# Patient Record
Sex: Female | Born: 1965 | Race: Black or African American | Hispanic: No | Marital: Married | Smoking: Former smoker
Health system: Southern US, Community
[De-identification: ages and names within clinical notes are randomized; demographics above are authoritative.]

## PROBLEM LIST (undated history)

## (undated) DIAGNOSIS — N184 Chronic kidney disease, stage 4 (severe): Secondary | ICD-10-CM

## (undated) DIAGNOSIS — E785 Hyperlipidemia, unspecified: Secondary | ICD-10-CM

## (undated) DIAGNOSIS — J432 Centrilobular emphysema: Secondary | ICD-10-CM

## (undated) DIAGNOSIS — I10 Essential (primary) hypertension: Secondary | ICD-10-CM

## (undated) DIAGNOSIS — K760 Fatty (change of) liver, not elsewhere classified: Secondary | ICD-10-CM

## (undated) HISTORY — PX: ABDOMINAL HYSTERECTOMY: SHX81

## (undated) HISTORY — PX: OOPHORECTOMY: SHX86

---

## 2007-09-30 ENCOUNTER — Emergency Department (HOSPITAL_BASED_OUTPATIENT_CLINIC_OR_DEPARTMENT_OTHER): Admission: EM | Admit: 2007-09-30 | Discharge: 2007-09-30 | Payer: Self-pay | Admitting: Emergency Medicine

## 2011-01-16 ENCOUNTER — Emergency Department (HOSPITAL_BASED_OUTPATIENT_CLINIC_OR_DEPARTMENT_OTHER)
Admission: EM | Admit: 2011-01-16 | Discharge: 2011-01-16 | Disposition: A | Discharge status: Home / Self Care | Payer: PRIVATE HEALTH INSURANCE

## 2011-01-16 ENCOUNTER — Emergency Department (HOSPITAL_BASED_OUTPATIENT_CLINIC_OR_DEPARTMENT_OTHER)
Admission: EM | Admit: 2011-01-16 | Discharge: 2011-01-17 | Disposition: A | Discharge status: Home / Self Care | Payer: PRIVATE HEALTH INSURANCE | Attending: Emergency Medicine | Admitting: Emergency Medicine

## 2011-01-16 DIAGNOSIS — M25462 Effusion, left knee: Secondary | ICD-10-CM

## 2011-01-16 DIAGNOSIS — F17200 Nicotine dependence, unspecified, uncomplicated: Secondary | ICD-10-CM | POA: Insufficient documentation

## 2011-01-16 DIAGNOSIS — M25562 Pain in left knee: Secondary | ICD-10-CM

## 2011-01-16 DIAGNOSIS — I10 Essential (primary) hypertension: Secondary | ICD-10-CM | POA: Insufficient documentation

## 2011-01-16 DIAGNOSIS — E119 Type 2 diabetes mellitus without complications: Secondary | ICD-10-CM | POA: Insufficient documentation

## 2011-01-16 DIAGNOSIS — M25469 Effusion, unspecified knee: Secondary | ICD-10-CM | POA: Insufficient documentation

## 2011-01-16 DIAGNOSIS — M25569 Pain in unspecified knee: Secondary | ICD-10-CM | POA: Insufficient documentation

## 2011-01-16 DIAGNOSIS — Z79899 Other long term (current) drug therapy: Secondary | ICD-10-CM | POA: Insufficient documentation

## 2011-01-16 HISTORY — DX: Essential (primary) hypertension: I10

## 2011-01-17 ENCOUNTER — Emergency Department (INDEPENDENT_AMBULATORY_CARE_PROVIDER_SITE_OTHER): Payer: PRIVATE HEALTH INSURANCE

## 2011-01-17 ENCOUNTER — Encounter: Payer: Self-pay | Admitting: *Deleted

## 2011-01-17 DIAGNOSIS — M25469 Effusion, unspecified knee: Secondary | ICD-10-CM

## 2011-01-17 DIAGNOSIS — M25569 Pain in unspecified knee: Secondary | ICD-10-CM

## 2011-01-17 DIAGNOSIS — M7989 Other specified soft tissue disorders: Secondary | ICD-10-CM

## 2011-01-17 MED ORDER — OXYCODONE-ACETAMINOPHEN 5-325 MG PO TABS: 1.0000 | ORAL_TABLET | Freq: Four times a day (QID) | ORAL | Status: AC | PRN

## 2011-01-17 MED ORDER — OXYCODONE-ACETAMINOPHEN 5-325 MG PO TABS
1.0000 | ORAL_TABLET | Freq: Once | ORAL | Status: AC
  Administered 2011-01-17: 1 via ORAL
  Filled 2011-01-17: qty 1

## 2011-01-17 MED ORDER — IBUPROFEN 600 MG PO TABS: 600.0000 mg | ORAL_TABLET | Freq: Four times a day (QID) | ORAL | Status: AC | PRN

## 2011-01-17 NOTE — ED Provider Notes (Signed)
History    Scribed for Threasa Beards, MD, the patient was seen in room MH06/MH06. This chart was scribed by Lyndee Hensen.   CSN: [id number] Arrival date & time: 01/16/2011 11:58 PM   First MD Initiated Contact with Patient 01/17/11 0009      Chief Complaint  Patient presents with  . Knee Pain    (Consider location/radiation/quality/duration/timing/severity/associated sxs/prior treatment) Patient is a 45 y.o. female presenting with knee pain. The history is provided by the patient. No language interpreter was used.  Knee Pain This is a new problem. Episode onset: 5 days  The problem occurs constantly. The problem has not changed since onset.The symptoms are aggravated by bending and twisting. The symptoms are relieved by nothing.   Patient complains of pain and swelling of left knee.There is no known injury or trauma.  Pain began intermittent and is now constant.  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   Past Medical History  Diagnosis Date  . Diabetes mellitus   . Hypertension     Past Surgical History  Procedure Date  . Abdominal hysterectomy     History reviewed. No pertinent family history.  History  Substance Use Topics  . Smoking status: Current Everyday Smoker  . Smokeless tobacco: Not on file  . Alcohol Use: No    OB History    Grav Para Term Preterm Abortions TAB SAB Ect Mult Living                  Review of Systems  Constitutional: Negative for fever.  Musculoskeletal: Negative for back pain.  All other systems reviewed and are negative.    Allergies  Sulfa antibiotics  Home Medications   Current Outpatient Rx  Name Route Sig Dispense Refill  . AMLODIPINE BESYLATE 10 MG PO TABS Oral Take 10 mg by mouth daily.      Marland Kitchen METFORMIN HCL ER (MOD) 1000 MG PO TB24 Oral Take 1,000 mg by mouth 2 (two) times daily with a meal.      . METOPROLOL SUCCINATE 100 MG PO TB24 Oral Take 100 mg by mouth daily.      . TELMISARTAN 40 MG PO  TABS Oral Take 40 mg by mouth daily.      . IBUPROFEN 600 MG PO TABS Oral Take 1 tablet (600 mg total) by mouth every 6 (six) hours as needed for pain. 30 tablet 0  . OXYCODONE-ACETAMINOPHEN 5-325 MG PO TABS Oral Take 1-2 tablets by mouth every 6 (six) hours as needed for pain. 15 tablet 0    BP 137/89  Pulse 85  Temp(Src) 98.1 F (36.7 C) (Oral)  Resp 16  SpO2 98%  Physical Exam  Constitutional: She is oriented to person, place, and time. She appears well-developed and well-nourished.  Non-toxic appearance. She does not have a sickly appearance. No distress.  HENT:  Head: Normocephalic and atraumatic.  Eyes: Conjunctivae, EOM and lids are normal. Pupils are equal, round, and reactive to light. No scleral icterus.  Neck: Trachea normal and normal range of motion. Neck supple.  Cardiovascular: Normal rate, regular rhythm, normal heart sounds and intact distal pulses.   Pulmonary/Chest: Effort normal and breath sounds normal. No respiratory distress.  Abdominal: Soft. Normal appearance. There is no tenderness. There is no rebound, no guarding and no CVA tenderness.  Musculoskeletal: Normal range of motion.        tender to palpation over left patella, FROM of left knee, mild swelling of left knee,Good  palpable dp pulses, neurovascular intact,   Neurological: She is alert and oriented to person, place, and time. She has normal strength.  Skin: Skin is warm, dry and intact. No rash noted.  Psychiatric: She has a normal mood and affect. Her behavior is normal.    ED Course  Procedures (including critical care time)   DIAGNOSTIC STUDIES: Oxygen Saturation is 98% on room air, normal by my interpretation.     COORDINATION OF CARE:  12:31 AM  Physical exam complete.  Pain Control.  Xray left knee.   Orders Placed This Encounter  Procedures  . DG Knee 1-2 Views Left  . Apply knee immobilizer     LABS / RADIOLOGY:   Labs Reviewed - No data to display Dg Knee 1-2 Views  Left  01/17/2011  *RADIOLOGY REPORT*  Clinical Data: Left knee pain and swelling.  LEFT KNEE - 1-2 VIEW  Comparison: None.  Findings: There is no evidence of fracture or dislocation.  The joint spaces are preserved.  No significant degenerative change is seen; the patellofemoral joint is grossly unremarkable in appearance.  A moderate joint effusion is seen.  The visualized soft tissues are otherwise unremarkable in appearance.  IMPRESSION:  1.  No evidence of fracture or dislocation. 2.  Moderate knee joint effusion noted.  Original Report Authenticated By: Santa Lighter, M.D.         MDM   MDM: Patient presenting with pain in her left knee which has been present for several days. There is no specific injury to her knee. She has a history of osteoarthritis which primarily has caused pain in her right knee in the past. She feels that her left knee is swollen and painful. Pain is worse with walking. X-ray shows a knee effusion but no other acute findings. I suspect this is due to arthritis. We'll discharge her with pain medication and a knee immobilizer. She was discharged with strict return precautions and is agreeable with this plan.     MEDICATIONS GIVEN IN THE E.D. Scheduled Meds:    . oxyCODONE-acetaminophen  1 tablet Oral Once   Continuous Infusions:      IMPRESSION: 1. Pain in left knee   2. Effusion of left knee      DISCHARGE MEDICATIONS: New Prescriptions   IBUPROFEN (ADVIL,MOTRIN) 600 MG TABLET    Take 1 tablet (600 mg total) by mouth every 6 (six) hours as needed for pain.   OXYCODONE-ACETAMINOPHEN (PERCOCET) 5-325 MG PER TABLET    Take 1-2 tablets by mouth every 6 (six) hours as needed for pain.      I personally performed the services described in this documentation, which was scribed in my presence. The recorded information has been reviewed and considered.            Threasa Beards, MD 01/17/11 [phone]

## 2011-01-17 NOTE — ED Notes (Signed)
Pt reports that she began having knee pain and swelling on Tuesday left knee with significant  swelling and pain denies recent injury

## 2011-06-08 ENCOUNTER — Emergency Department (INDEPENDENT_AMBULATORY_CARE_PROVIDER_SITE_OTHER): Payer: Self-pay

## 2011-06-08 ENCOUNTER — Encounter (HOSPITAL_BASED_OUTPATIENT_CLINIC_OR_DEPARTMENT_OTHER): Payer: Self-pay | Admitting: *Deleted

## 2011-06-08 ENCOUNTER — Emergency Department (HOSPITAL_BASED_OUTPATIENT_CLINIC_OR_DEPARTMENT_OTHER)
Admission: EM | Admit: 2011-06-08 | Discharge: 2011-06-08 | Disposition: A | Discharge status: Home / Self Care | Payer: Self-pay | Attending: Emergency Medicine | Admitting: Emergency Medicine

## 2011-06-08 DIAGNOSIS — I10 Essential (primary) hypertension: Secondary | ICD-10-CM | POA: Insufficient documentation

## 2011-06-08 DIAGNOSIS — E1169 Type 2 diabetes mellitus with other specified complication: Secondary | ICD-10-CM | POA: Insufficient documentation

## 2011-06-08 DIAGNOSIS — G629 Polyneuropathy, unspecified: Secondary | ICD-10-CM

## 2011-06-08 DIAGNOSIS — E119 Type 2 diabetes mellitus without complications: Secondary | ICD-10-CM

## 2011-06-08 DIAGNOSIS — G609 Hereditary and idiopathic neuropathy, unspecified: Secondary | ICD-10-CM | POA: Insufficient documentation

## 2011-06-08 DIAGNOSIS — Z79899 Other long term (current) drug therapy: Secondary | ICD-10-CM | POA: Insufficient documentation

## 2011-06-08 DIAGNOSIS — R7309 Other abnormal glucose: Secondary | ICD-10-CM

## 2011-06-08 DIAGNOSIS — M479 Spondylosis, unspecified: Secondary | ICD-10-CM

## 2011-06-08 DIAGNOSIS — R209 Unspecified disturbances of skin sensation: Secondary | ICD-10-CM | POA: Insufficient documentation

## 2011-06-08 DIAGNOSIS — J40 Bronchitis, not specified as acute or chronic: Secondary | ICD-10-CM | POA: Insufficient documentation

## 2011-06-08 DIAGNOSIS — R0602 Shortness of breath: Secondary | ICD-10-CM

## 2011-06-08 LAB — GLUCOSE, CAPILLARY: Glucose-Capillary: 166 mg/dL — ABNORMAL HIGH (ref 70–99)

## 2011-06-08 LAB — COMPREHENSIVE METABOLIC PANEL
ALT: 18 U/L (ref 0–35)
AST: 20 U/L (ref 0–37)
CO2: 27 mEq/L (ref 19–32)
Chloride: 102 mEq/L (ref 96–112)
GFR calc non Af Amer: 86 mL/min — ABNORMAL LOW (ref >90)
Sodium: 141 mEq/L (ref 135–145)
Total Bilirubin: 0.4 mg/dL (ref 0.3–1.2)

## 2011-06-08 LAB — DIFFERENTIAL
Basophils Absolute: 0 10*3/uL (ref 0.0–0.1)
Lymphocytes Relative: 31 % (ref 12–46)
Lymphs Abs: 2 10*3/uL (ref 0.7–4.0)
Monocytes Absolute: 0.4 10*3/uL (ref 0.1–1.0)
Neutro Abs: 3.3 10*3/uL (ref 1.7–7.7)

## 2011-06-08 LAB — URINE MICROSCOPIC-ADD ON

## 2011-06-08 LAB — URINALYSIS, ROUTINE W REFLEX MICROSCOPIC
Bilirubin Urine: NEGATIVE
Hgb urine dipstick: NEGATIVE
Ketones, ur: NEGATIVE mg/dL
Protein, ur: 100 mg/dL — AB
Urobilinogen, UA: 0.2 mg/dL (ref 0.0–1.0)

## 2011-06-08 LAB — CBC
HCT: 37.4 % (ref 36.0–46.0)
Platelets: 319 10*3/uL (ref 150–400)
RBC: 4.48 MIL/uL (ref 3.87–5.11)
RDW: 13.9 % (ref 11.5–15.5)
WBC: 6.3 10*3/uL (ref 4.0–10.5)

## 2011-06-08 MED ORDER — GABAPENTIN 100 MG PO CAPS: 100.0000 mg | ORAL_CAPSULE | Freq: Three times a day (TID) | ORAL | Status: DC

## 2011-06-08 MED ORDER — SODIUM CHLORIDE 0.9 % IV BOLUS (SEPSIS)
1000.0000 mL | Freq: Once | INTRAVENOUS | Status: AC
  Administered 2011-06-08: 1000 mL via INTRAVENOUS

## 2011-06-08 NOTE — Discharge Instructions (Signed)
Bronchitis Bronchitis is the body's way of reacting to injury and/or infection (inflammation) of the bronchi. Bronchi are the air tubes that extend from the windpipe into the lungs. If the inflammation becomes severe, it may cause shortness of breath. CAUSES  Inflammation may be caused by:  A virus.   Germs (bacteria).   Dust.   Allergens.   Pollutants and many other irritants.  The cells lining the bronchial tree are covered with tiny hairs (cilia). These constantly beat upward, away from the lungs, toward the mouth. This keeps the lungs free of pollutants. When these cells become too irritated and are unable to do their job, mucus begins to develop. This causes the characteristic cough of bronchitis. The cough clears the lungs when the cilia are unable to do their job. Without either of these protective mechanisms, the mucus would settle in the lungs. Then you would develop pneumonia. Smoking is a common cause of bronchitis and can contribute to pneumonia. Stopping this habit is the single most important thing you can do to help yourself. TREATMENT   Your caregiver may prescribe an antibiotic if the cough is caused by bacteria. Also, medicines that open up your airways make it easier to breathe. Your caregiver may also recommend or prescribe an expectorant. It will loosen the mucus to be coughed up. Only take over-the-counter or prescription medicines for pain, discomfort, or fever as directed by your caregiver.   Removing whatever causes the problem (smoking, for example) is critical to preventing the problem from getting worse.   Cough suppressants may be prescribed for relief of cough symptoms.   Inhaled medicines may be prescribed to help with symptoms now and to help prevent problems from returning.   For those with recurrent (chronic) bronchitis, there may be a need for steroid medicines.  SEEK IMMEDIATE MEDICAL CARE IF:   During treatment, you develop more pus-like mucus  (purulent sputum).   You have a fever.   Your baby is older than 3 months with a rectal temperature of 102 F (38.9 C) or higher.   Your baby is 2 months old or younger with a rectal temperature of 100.4 F (38 C) or higher.   You become progressively more ill.   You have increased difficulty breathing, wheezing, or shortness of breath.  It is necessary to seek immediate medical care if you are elderly or sick from any other disease. MAKE SURE YOU:   Understand these instructions.   Will watch your condition.   Will get help right away if you are not doing well or get worse.  Document Released: 02/08/2005 Document Revised: 01/28/2011 Document Reviewed: 12/19/2007 [facility] Patient Information 2012 [place], [place].  Neuropathy Neuropathy means your peripheral nerves are not working normally. Peripheral nerves are the nerves outside the brain and spinal cord. Messages between the brain and the rest of the body do not work properly with peripheral nerve disorders. CAUSES There are many different causes of peripheral nerve disorders. These include:  Injury.   Infections.   Diabetes.   Vitamin deficiency.   Poor circulation.   Alcoholism.   Exposure to toxins.   Drug effects.   Tumors.   Kidney disease.  SYMPTOMS  Tingling, burning, pain, and numbness in the extremities.   Weakness and loss of muscle tone and size.  DIAGNOSIS Blood tests and special studies of nerve function may help confirm the diagnosis.  TREATMENT  Treatment includes adopting healthy life habits.   A good diet, vitamin supplements, and mild pain  medicine may be needed.   Avoid known toxins such as alcohol, tobacco, and recreational drugs.   Anti-convulsant medicines are helpful in some types of neuropathy.  Make a follow-up appointment with your caregiver to be sure you are getting better with treatment.  SEEK IMMEDIATE MEDICAL CARE IF:   You have breathing problems.   You have severe  or uncontrolled pain.   You notice extreme weakness or you feel faint.   You are not better after 1 week or if you have worse symptoms.  Document Released: 03/18/2004 Document Revised: 10/21/2010 Document Reviewed: 02/08/2005 [facility] Patient Information 2012 [place], [place].

## 2011-06-08 NOTE — ED Notes (Signed)
Patient states she was at work and had a sudden onset of numbness in her feet and hands and sob.  States she had had the same symptoms off and on for the last 2 weeks.  States she was diagnosed with neuropathy of her feet 2-3 months ago.

## 2011-06-08 NOTE — ED Provider Notes (Signed)
History     CSN: [id number]  Arrival date & time 06/08/11  [id number]   First MD Initiated Contact with Patient 06/08/11 1114      Chief Complaint  Patient presents with  . Hyperglycemia    (Consider location/radiation/quality/duration/timing/severity/associated sxs/prior treatment) HPI Pt has had > 2 weeks of SOB with nonproductive cough. No chest pain fever, chills, LE edema or pain. Pt was recently diagnosed with neuropathy and has increased tingling in her bl fingers and toes. Pt was concerned that her blood sugar was high and came to the ED to be evaluated. No weakness Past Medical History  Diagnosis Date  . Diabetes mellitus   . Hypertension     Past Surgical History  Procedure Date  . Abdominal hysterectomy     History reviewed. No pertinent family history.  History  Substance Use Topics  . Smoking status: Current Everyday Smoker  . Smokeless tobacco: Not on file  . Alcohol Use: No    OB History    Grav Para Term Preterm Abortions TAB SAB Ect Mult Living                  Review of Systems  Constitutional: Negative for fever and chills.  HENT: Negative for neck pain.   Respiratory: Positive for cough and shortness of breath. Negative for chest tightness and wheezing.   Cardiovascular: Negative for chest pain, palpitations and leg swelling.  Gastrointestinal: Negative for nausea, vomiting and abdominal pain.  Genitourinary: Negative for dysuria.  Musculoskeletal: Negative for back pain.  Neurological: Positive for numbness. Negative for dizziness, weakness and headaches.    Allergies  Sulfa antibiotics  Home Medications   Current Outpatient Rx  Name Route Sig Dispense Refill  . AMLODIPINE BESYLATE 10 MG PO TABS Oral Take 10 mg by mouth daily.      Marland Kitchen GABAPENTIN 100 MG PO CAPS Oral Take 1 capsule (100 mg total) by mouth 3 (three) times daily. 90 capsule 0  . METFORMIN HCL ER (MOD) 1000 MG PO TB24 Oral Take 1,000 mg by mouth 2 (two) times daily with a meal.       . METOPROLOL SUCCINATE ER 100 MG PO TB24 Oral Take 100 mg by mouth daily.      . TELMISARTAN 40 MG PO TABS Oral Take 40 mg by mouth daily.        BP 125/83  Pulse 82  Temp(Src) 98.2 F (36.8 C) (Oral)  Resp 16  Ht 5\' 6"  (1.676 m)  Wt 192 lb (87.091 kg)  BMI 30.99 kg/m2  SpO2 100%  Physical Exam  Nursing note and vitals reviewed. Constitutional: She is oriented to person, place, and time. She appears well-developed and well-nourished. No distress.  HENT:  Head: Normocephalic and atraumatic.  Mouth/Throat: Oropharynx is clear and moist.  Eyes: EOM are normal. Pupils are equal, round, and reactive to light.  Neck: Normal range of motion. Neck supple.  Cardiovascular: Normal rate and regular rhythm.   Pulmonary/Chest: Effort normal and breath sounds normal. No respiratory distress. She has no wheezes. She has no rales.  Abdominal: Soft. Bowel sounds are normal. There is no tenderness. There is no rebound and no guarding.  Musculoskeletal: Normal range of motion. She exhibits no edema and no tenderness.       No calf tenderness or swelling  Neurological: She is alert and oriented to person, place, and time.       Pt has paraesthesias of her fingers and toes bl. 5/5 motor in all  ext.   Skin: Skin is warm and dry. No rash noted. No erythema.  Psychiatric: She has a normal mood and affect. Her behavior is normal.    ED Course  Procedures (including critical care time)  Labs Reviewed  GLUCOSE, CAPILLARY - Abnormal; Notable for the following:    Glucose-Capillary 166 (*)    All other components within normal limits  CBC - Abnormal; Notable for the following:    MCHC 36.1 (*)    All other components within normal limits  DIFFERENTIAL - Abnormal; Notable for the following:    Eosinophils Relative 9 (*)    All other components within normal limits  COMPREHENSIVE METABOLIC PANEL - Abnormal; Notable for the following:    Potassium 3.3 (*)    GFR calc non Af Amer 86 (*)    All  other components within normal limits  URINALYSIS, ROUTINE W REFLEX MICROSCOPIC - Abnormal; Notable for the following:    Protein, ur 100 (*)    All other components within normal limits  URINE MICROSCOPIC-ADD ON - Abnormal; Notable for the following:    Casts HYALINE CASTS (*)    All other components within normal limits  TROPONIN I   Dg Chest 2 View  06/08/2011  *RADIOLOGY REPORT*  Clinical Data: Shortness of breath.  Hyperglycemia.  Diabetes.  CHEST - 2 VIEW  Comparison: 09/30/2007  Findings: Mild thoracic and upper lumbar spondylosis noted.  Cardiac and mediastinal contours appear normal.  The lungs appear clear.  No pleural effusion is identified.  IMPRESSION:  1.  Mild spondylosis.  No acute thoracic findings.  Original Report Authenticated By: Carron Curie, M.D.     1. Peripheral neuropathy   2. Bronchitis      Date: 06/08/2011  Rate: 73  Rhythm: normal sinus rhythm  QRS Axis: normal  Intervals: normal  ST/T Wave abnormalities: normal  Conduction Disutrbances:none  Narrative Interpretation:   Old EKG Reviewed: none available    MDM  Pt with appointment to f/u with PMD. Will start gabapentin low dose for diabetic neuropathy. Pt counseled to stop smoking which is likely contributing to her ongoing bronchitis        Julianne Rice, MD 06/08/11 1139

## 2012-05-22 ENCOUNTER — Other Ambulatory Visit: Payer: Self-pay | Admitting: Obstetrics and Gynecology

## 2012-12-18 ENCOUNTER — Emergency Department (HOSPITAL_BASED_OUTPATIENT_CLINIC_OR_DEPARTMENT_OTHER)
Admission: EM | Admit: 2012-12-18 | Discharge: 2012-12-18 | Disposition: A | Discharge status: Home / Self Care | Payer: PRIVATE HEALTH INSURANCE | Attending: Emergency Medicine | Admitting: Emergency Medicine

## 2012-12-18 ENCOUNTER — Encounter (HOSPITAL_BASED_OUTPATIENT_CLINIC_OR_DEPARTMENT_OTHER): Payer: Self-pay | Admitting: Emergency Medicine

## 2012-12-18 DIAGNOSIS — Z79899 Other long term (current) drug therapy: Secondary | ICD-10-CM | POA: Insufficient documentation

## 2012-12-18 DIAGNOSIS — A5900 Urogenital trichomoniasis, unspecified: Secondary | ICD-10-CM

## 2012-12-18 DIAGNOSIS — I10 Essential (primary) hypertension: Secondary | ICD-10-CM | POA: Insufficient documentation

## 2012-12-18 DIAGNOSIS — E119 Type 2 diabetes mellitus without complications: Secondary | ICD-10-CM | POA: Insufficient documentation

## 2012-12-18 DIAGNOSIS — H109 Unspecified conjunctivitis: Secondary | ICD-10-CM | POA: Insufficient documentation

## 2012-12-18 DIAGNOSIS — R52 Pain, unspecified: Secondary | ICD-10-CM

## 2012-12-18 DIAGNOSIS — Z87891 Personal history of nicotine dependence: Secondary | ICD-10-CM | POA: Insufficient documentation

## 2012-12-18 DIAGNOSIS — J029 Acute pharyngitis, unspecified: Secondary | ICD-10-CM | POA: Insufficient documentation

## 2012-12-18 LAB — URINALYSIS, ROUTINE W REFLEX MICROSCOPIC
Ketones, ur: NEGATIVE mg/dL
Nitrite: NEGATIVE
Specific Gravity, Urine: 1.014 (ref 1.005–1.030)
pH: 6.5 (ref 5.0–8.0)

## 2012-12-18 LAB — CBC WITH DIFFERENTIAL/PLATELET
Basophils Relative: 1 % (ref 0–1)
Eosinophils Absolute: 0.3 10*3/uL (ref 0.0–0.7)
Eosinophils Relative: 6 % — ABNORMAL HIGH (ref 0–5)
HCT: 38.4 % (ref 36.0–46.0)
Hemoglobin: 13.2 g/dL (ref 12.0–15.0)
MCH: 27.8 pg (ref 26.0–34.0)
MCHC: 34.4 g/dL (ref 30.0–36.0)
MCV: 80.8 fL (ref 78.0–100.0)
Monocytes Relative: 8 % (ref 3–12)
Neutro Abs: 2.4 10*3/uL (ref 1.7–7.7)
Neutrophils Relative %: 53 % (ref 43–77)
Platelets: 329 10*3/uL (ref 150–400)
RBC: 4.75 MIL/uL (ref 3.87–5.11)

## 2012-12-18 LAB — URINE MICROSCOPIC-ADD ON

## 2012-12-18 LAB — BASIC METABOLIC PANEL
BUN: 10 mg/dL (ref 6–23)
Chloride: 101 mEq/L (ref 96–112)
GFR calc Af Amer: 86 mL/min — ABNORMAL LOW (ref >90)
GFR calc non Af Amer: 74 mL/min — ABNORMAL LOW (ref >90)
Potassium: 3.9 mEq/L (ref 3.5–5.1)
Sodium: 139 mEq/L (ref 135–145)

## 2012-12-18 MED ORDER — METRONIDAZOLE 500 MG PO TABS
2000.0000 mg | ORAL_TABLET | Freq: Once | ORAL | Status: AC
  Administered 2012-12-18: 2000 mg via ORAL
  Filled 2012-12-18: qty 4

## 2012-12-18 MED ORDER — ERYTHROMYCIN 5 MG/GM OP OINT: TOPICAL_OINTMENT | OPHTHALMIC | Status: AC

## 2012-12-18 NOTE — ED Notes (Signed)
generalized body aches, runny nose, rt eye watering onset Saturday

## 2012-12-18 NOTE — ED Notes (Signed)
Patient states she has a three day history of generalized body aches, headache, chills, nausea and mild sore throat.

## 2012-12-18 NOTE — ED Provider Notes (Signed)
CSN: [id number]     Arrival date & time 12/18/12  [id number] History   First MD Initiated Contact with Patient 12/18/12 [phone]     Chief Complaint  Patient presents with  . Generalized Body Aches   (Consider location/radiation/quality/duration/timing/severity/associated sxs/prior Treatment) HPI 47 y.o. Female with history of niddm presents complaining of several days of uri,sore throat, body aches and myalgias.  No dificulty speaking, swallowing, no dyspnea, chest pain, nausea, but no vomiting or abdominal pain.  BS unknown- no monitor.  No uti symptoms.   Past Medical History  Diagnosis Date  . Diabetes mellitus   . Hypertension    Past Surgical History  Procedure Laterality Date  . Abdominal hysterectomy    . Oophorectomy     History reviewed. No pertinent family history. History  Substance Use Topics  . Smoking status: Former Research scientist (life sciences)  . Smokeless tobacco: Not on file  . Alcohol Use: No   OB History   Grav Para Term Preterm Abortions TAB SAB Ect Mult Living                 Review of Systems  All other systems reviewed and are negative.    Allergies  Sulfa antibiotics  Home Medications   Current Outpatient Rx  Name  Route  Sig  Dispense  Refill  . traMADol (ULTRAM) 50 MG tablet   Oral   Take 50 mg by mouth every 6 (six) hours as needed for pain.         Marland Kitchen amLODipine (NORVASC) 10 MG tablet   Oral   Take 10 mg by mouth daily.           Marland Kitchen EXPIRED: gabapentin (NEURONTIN) 100 MG capsule   Oral   Take 1 capsule (100 mg total) by mouth 3 (three) times daily.   90 capsule   0   . metFORMIN (GLUMETZA) 1000 MG (MOD) 24 hr tablet   Oral   Take 1,000 mg by mouth 2 (two) times daily with a meal.           . metoprolol (TOPROL-XL) 100 MG 24 hr tablet   Oral   Take 100 mg by mouth daily.           Marland Kitchen telmisartan (MICARDIS) 40 MG tablet   Oral   Take 40 mg by mouth daily.            BP 138/90  Pulse 71  Temp(Src) 98.4 F (36.9 C) (Oral)  Resp 20  SpO2  97% Physical Exam  Nursing note and vitals reviewed. Constitutional: She is oriented to person, place, and time. She appears well-developed and well-nourished.  HENT:  Head: Normocephalic and atraumatic.  Right Ear: External ear normal.  Left Ear: External ear normal.  Nose: Nose normal.  Mouth/Throat: Oropharynx is clear and moist.  Eyes: Conjunctivae and EOM are normal. Pupils are equal, round, and reactive to light.  Neck: Normal range of motion. Neck supple. No JVD present. No tracheal deviation present. No thyromegaly present.  Cardiovascular: Normal rate, regular rhythm, normal heart sounds and intact distal pulses.   Pulmonary/Chest: Effort normal and breath sounds normal. No respiratory distress. She has no wheezes.  Abdominal: Soft. Bowel sounds are normal. She exhibits no mass. There is no tenderness. There is no guarding.  Musculoskeletal: Normal range of motion.  Lymphadenopathy:    She has no cervical adenopathy.  Neurological: She is alert and oriented to person, place, and time. She has normal reflexes. No cranial nerve deficit  or sensory deficit. Gait normal. GCS eye subscore is 4. GCS verbal subscore is 5. GCS motor subscore is 6.  Reflex Scores:      Bicep reflexes are 2+ on the right side and 2+ on the left side.      Patellar reflexes are 2+ on the right side and 2+ on the left side. Strength is 5/5 bilateral elbow flexor/extensors, wrist extension/flexion, intrinsic hand strength equal Bilateral hip flexion/extension 5/5, knee flexion/extension 5/5, ankle 5/5 flexion extension    Skin: Skin is warm and dry.  Psychiatric: She has a normal mood and affect. Her behavior is normal. Judgment and thought content normal.    ED Course  Procedures (including critical care time) Labs Review Labs Reviewed  URINALYSIS, ROUTINE W REFLEX MICROSCOPIC  URINALYSIS, ROUTINE W REFLEX MICROSCOPIC  CBC WITH DIFFERENTIAL  BASIC METABOLIC PANEL   Imaging Review No results  found.  EKG Interpretation   None       MDM   Trichomonas noted in urine.  Patient treated with flagyl and advised.  Patient with possible viral syndrome but does not appear acutely ill.  She is advised regarding close followup.    Shaune Pollack, MD 12/20/12 [phone]

## 2012-12-19 LAB — URINE CULTURE

## 2019-02-23 HISTORY — PX: TOTAL KNEE ARTHROPLASTY: SHX125

## 2020-09-15 ENCOUNTER — Emergency Department (HOSPITAL_BASED_OUTPATIENT_CLINIC_OR_DEPARTMENT_OTHER): Payer: BC Managed Care – PPO

## 2020-09-15 ENCOUNTER — Other Ambulatory Visit: Payer: Self-pay

## 2020-09-15 ENCOUNTER — Inpatient Hospital Stay (HOSPITAL_BASED_OUTPATIENT_CLINIC_OR_DEPARTMENT_OTHER)
Admission: EM | Admit: 2020-09-15 | Discharge: 2020-09-17 | DRG: 391 | Disposition: A | Discharge status: Home / Self Care | Payer: BC Managed Care – PPO | Attending: Internal Medicine | Admitting: Internal Medicine

## 2020-09-15 ENCOUNTER — Encounter (HOSPITAL_BASED_OUTPATIENT_CLINIC_OR_DEPARTMENT_OTHER): Payer: Self-pay | Admitting: Urology

## 2020-09-15 DIAGNOSIS — Z20822 Contact with and (suspected) exposure to covid-19: Secondary | ICD-10-CM | POA: Diagnosis present

## 2020-09-15 DIAGNOSIS — Z96652 Presence of left artificial knee joint: Secondary | ICD-10-CM | POA: Diagnosis present

## 2020-09-15 DIAGNOSIS — Z7984 Long term (current) use of oral hypoglycemic drugs: Secondary | ICD-10-CM

## 2020-09-15 DIAGNOSIS — Z90710 Acquired absence of both cervix and uterus: Secondary | ICD-10-CM

## 2020-09-15 DIAGNOSIS — K5792 Diverticulitis of intestine, part unspecified, without perforation or abscess without bleeding: Secondary | ICD-10-CM | POA: Diagnosis not present

## 2020-09-15 DIAGNOSIS — Z79891 Long term (current) use of opiate analgesic: Secondary | ICD-10-CM

## 2020-09-15 DIAGNOSIS — I10 Essential (primary) hypertension: Secondary | ICD-10-CM | POA: Diagnosis present

## 2020-09-15 DIAGNOSIS — N289 Disorder of kidney and ureter, unspecified: Secondary | ICD-10-CM

## 2020-09-15 DIAGNOSIS — J9601 Acute respiratory failure with hypoxia: Secondary | ICD-10-CM | POA: Diagnosis present

## 2020-09-15 DIAGNOSIS — Z87891 Personal history of nicotine dependence: Secondary | ICD-10-CM

## 2020-09-15 DIAGNOSIS — E1122 Type 2 diabetes mellitus with diabetic chronic kidney disease: Secondary | ICD-10-CM

## 2020-09-15 DIAGNOSIS — K760 Fatty (change of) liver, not elsewhere classified: Secondary | ICD-10-CM | POA: Diagnosis present

## 2020-09-15 DIAGNOSIS — I129 Hypertensive chronic kidney disease with stage 1 through stage 4 chronic kidney disease, or unspecified chronic kidney disease: Secondary | ICD-10-CM | POA: Diagnosis present

## 2020-09-15 DIAGNOSIS — E119 Type 2 diabetes mellitus without complications: Secondary | ICD-10-CM

## 2020-09-15 DIAGNOSIS — R0902 Hypoxemia: Secondary | ICD-10-CM

## 2020-09-15 DIAGNOSIS — Z79899 Other long term (current) drug therapy: Secondary | ICD-10-CM

## 2020-09-15 DIAGNOSIS — K5732 Diverticulitis of large intestine without perforation or abscess without bleeding: Principal | ICD-10-CM | POA: Diagnosis present

## 2020-09-15 DIAGNOSIS — N184 Chronic kidney disease, stage 4 (severe): Secondary | ICD-10-CM | POA: Diagnosis present

## 2020-09-15 DIAGNOSIS — Z882 Allergy status to sulfonamides status: Secondary | ICD-10-CM

## 2020-09-15 DIAGNOSIS — J432 Centrilobular emphysema: Secondary | ICD-10-CM | POA: Diagnosis present

## 2020-09-15 DIAGNOSIS — Z9079 Acquired absence of other genital organ(s): Secondary | ICD-10-CM

## 2020-09-15 DIAGNOSIS — E785 Hyperlipidemia, unspecified: Secondary | ICD-10-CM | POA: Diagnosis present

## 2020-09-15 HISTORY — DX: Fatty (change of) liver, not elsewhere classified: K76.0

## 2020-09-15 HISTORY — DX: Hyperlipidemia, unspecified: E78.5

## 2020-09-15 HISTORY — DX: Chronic kidney disease, stage 4 (severe): N18.4

## 2020-09-15 HISTORY — DX: Centrilobular emphysema: J43.2

## 2020-09-15 LAB — URINALYSIS, ROUTINE W REFLEX MICROSCOPIC
Bilirubin Urine: NEGATIVE
Glucose, UA: 250 mg/dL — AB
Hgb urine dipstick: NEGATIVE
Ketones, ur: NEGATIVE mg/dL
Leukocytes,Ua: NEGATIVE
Nitrite: NEGATIVE
Protein, ur: 100 mg/dL — AB
Specific Gravity, Urine: 1.01 (ref 1.005–1.030)
pH: 6 (ref 5.0–8.0)

## 2020-09-15 LAB — COMPREHENSIVE METABOLIC PANEL
ALT: 15 U/L (ref 0–44)
AST: 17 U/L (ref 15–41)
Albumin: 3.7 g/dL (ref 3.5–5.0)
Alkaline Phosphatase: 63 U/L (ref 38–126)
Anion gap: 11 (ref 5–15)
BUN: 24 mg/dL — ABNORMAL HIGH (ref 6–20)
CO2: 26 mmol/L (ref 22–32)
Calcium: 9.2 mg/dL (ref 8.9–10.3)
Chloride: 100 mmol/L (ref 98–111)
Creatinine, Ser: 2.1 mg/dL — ABNORMAL HIGH (ref 0.44–1.00)
GFR, Estimated: 27 mL/min — ABNORMAL LOW (ref >60)
Glucose, Bld: 97 mg/dL (ref 70–99)
Potassium: 4.2 mmol/L (ref 3.5–5.1)
Sodium: 137 mmol/L (ref 135–145)
Total Bilirubin: 0.6 mg/dL (ref 0.3–1.2)
Total Protein: 7.6 g/dL (ref 6.5–8.1)

## 2020-09-15 LAB — CBC WITH DIFFERENTIAL/PLATELET
Abs Immature Granulocytes: 0.08 10*3/uL — ABNORMAL HIGH (ref 0.00–0.07)
Basophils Absolute: 0 10*3/uL (ref 0.0–0.1)
Basophils Relative: 0 %
Eosinophils Absolute: 0.2 10*3/uL (ref 0.0–0.5)
Eosinophils Relative: 2 %
HCT: 38.6 % (ref 36.0–46.0)
Hemoglobin: 12.7 g/dL (ref 12.0–15.0)
Immature Granulocytes: 1 %
Lymphocytes Relative: 10 %
Lymphs Abs: 1.3 10*3/uL (ref 0.7–4.0)
MCH: 25.2 pg — ABNORMAL LOW (ref 26.0–34.0)
MCHC: 32.9 g/dL (ref 30.0–36.0)
MCV: 76.7 fL — ABNORMAL LOW (ref 80.0–100.0)
Monocytes Absolute: 1.1 10*3/uL — ABNORMAL HIGH (ref 0.1–1.0)
Monocytes Relative: 9 %
Neutro Abs: 10.1 10*3/uL — ABNORMAL HIGH (ref 1.7–7.7)
Neutrophils Relative %: 78 %
Platelets: 361 10*3/uL (ref 150–400)
RBC: 5.03 MIL/uL (ref 3.87–5.11)
RDW: 16.8 % — ABNORMAL HIGH (ref 11.5–15.5)
WBC: 12.9 10*3/uL — ABNORMAL HIGH (ref 4.0–10.5)
nRBC: 0 % (ref 0.0–0.2)

## 2020-09-15 LAB — URINALYSIS, MICROSCOPIC (REFLEX)

## 2020-09-15 LAB — RESP PANEL BY RT-PCR (FLU A&B, COVID) ARPGX2
Influenza A by PCR: NEGATIVE
Influenza B by PCR: NEGATIVE
SARS Coronavirus 2 by RT PCR: NEGATIVE

## 2020-09-15 LAB — LIPASE, BLOOD: Lipase: 41 U/L (ref 11–51)

## 2020-09-15 LAB — D-DIMER, QUANTITATIVE: D-Dimer, Quant: 1.36 ug/mL-FEU — ABNORMAL HIGH (ref 0.00–0.50)

## 2020-09-15 LAB — BRAIN NATRIURETIC PEPTIDE: B Natriuretic Peptide: 28.9 pg/mL (ref 0.0–100.0)

## 2020-09-15 IMAGING — CT CT ABD-PELV W/O CM
2 of 4 series · 14 of 46 positions shown, 16 images · non-contrast
Comparison: None.

CLINICAL DATA: Diverticulitis suspected, left lower quadrant pain
for 1 month

EXAM:
CT ABDOMEN AND PELVIS WITHOUT CONTRAST
TECHNIQUE: Multidetector CT imaging of the abdomen and pelvis was performed
following the standard protocol without IV contrast.

[Series 3: axial st · axial · 0.98mm/px · z∈[-313,+87]mm · 11 of 96 slices shown, 13 images]
[im 8/96  soft-tissue]
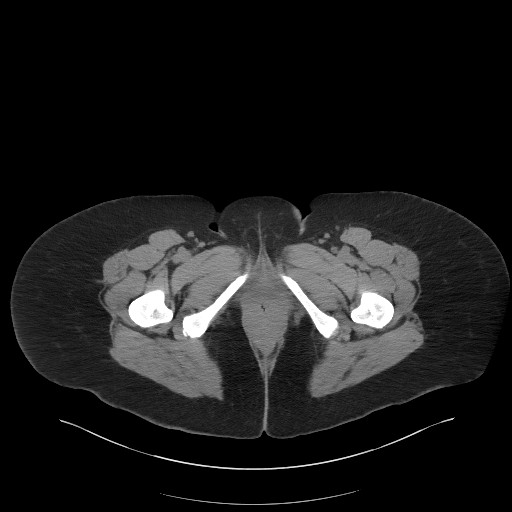
[im 8/96  bone]
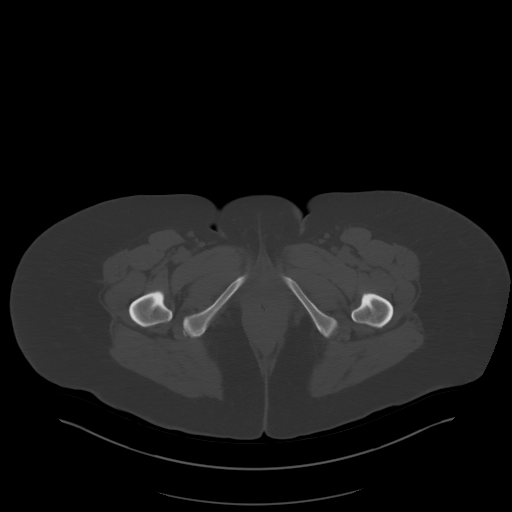
[im 16/96  soft-tissue]
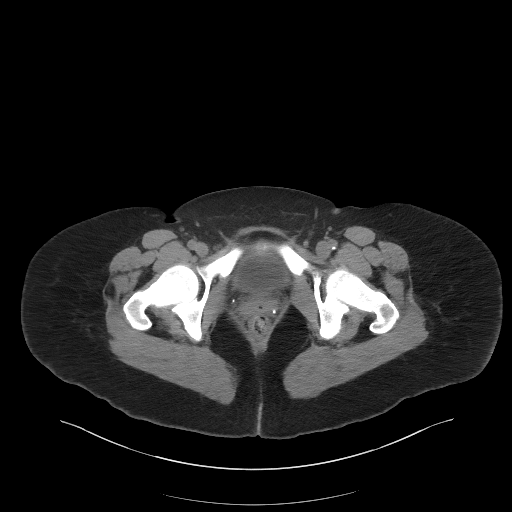
[im 23/96  soft-tissue]
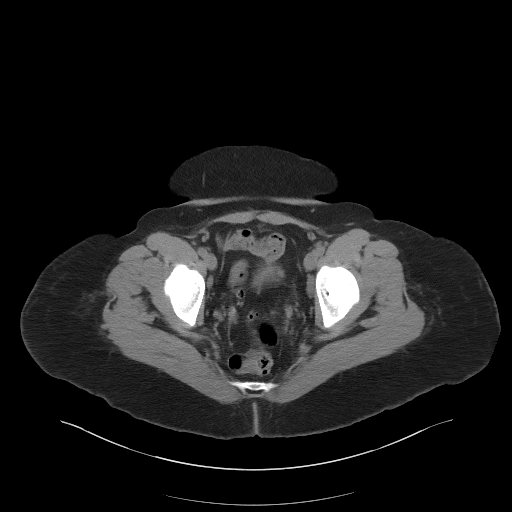
[im 31/96  soft-tissue]
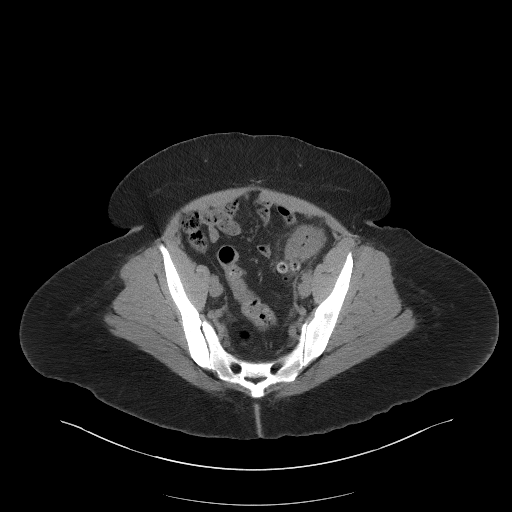
[im 39/96  soft-tissue]
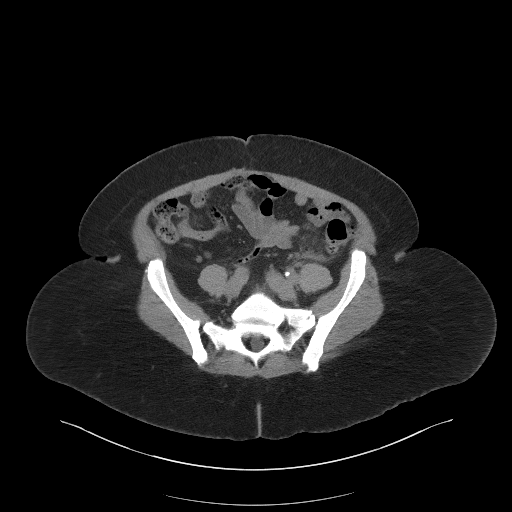
[im 50/96  soft-tissue]
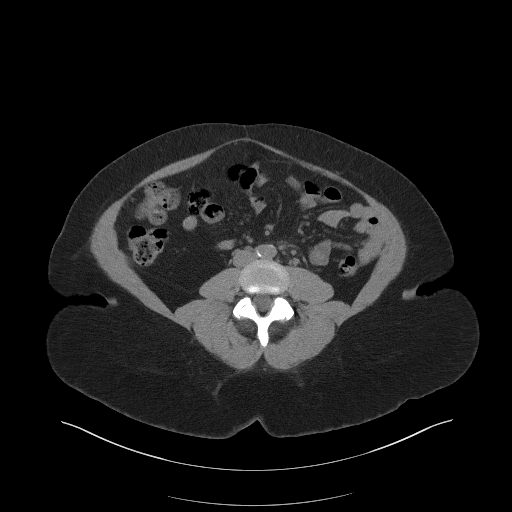
[im 58/96  soft-tissue]
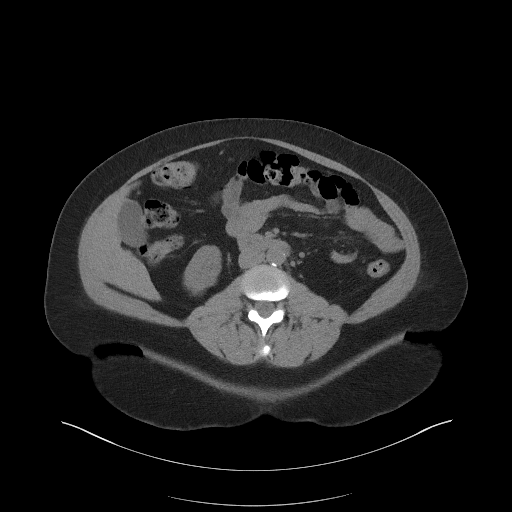
[im 65/96  soft-tissue]
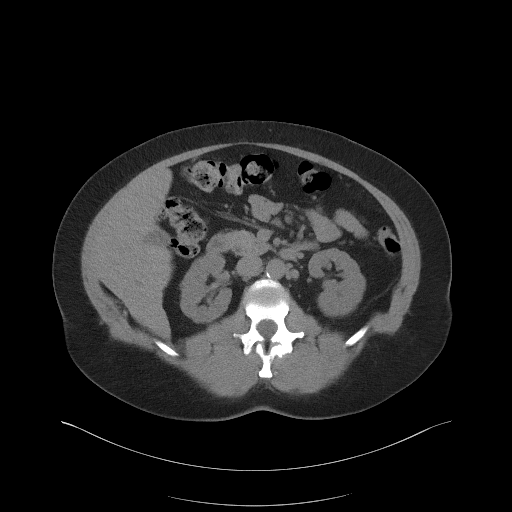
[im 73/96  soft-tissue]
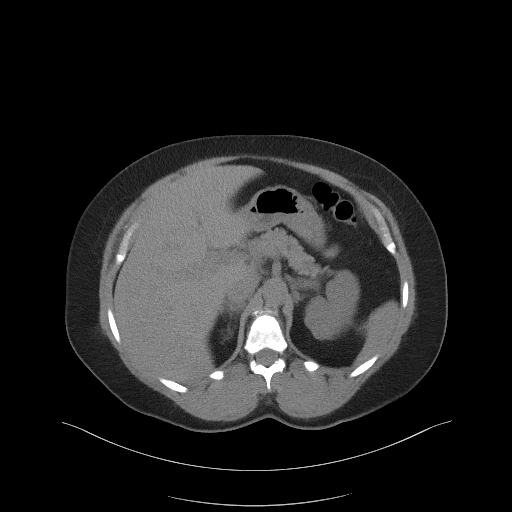
[im 73/96  bone]
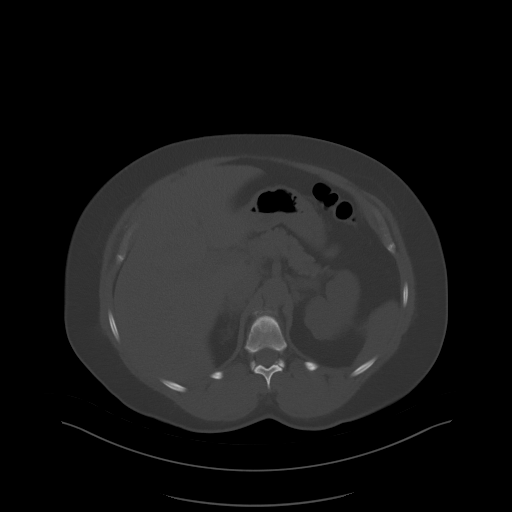
[im 80/96  soft-tissue]
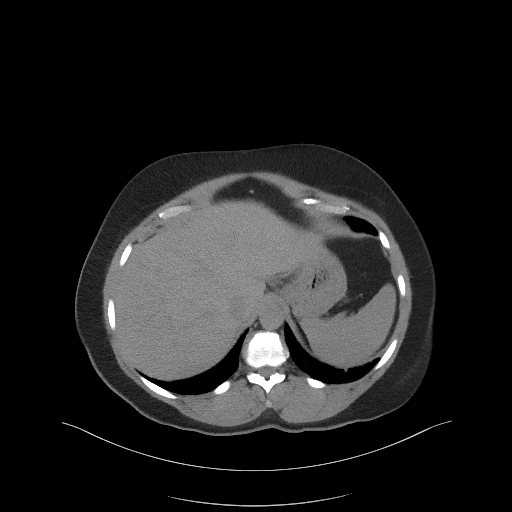
[im 88/96  soft-tissue]
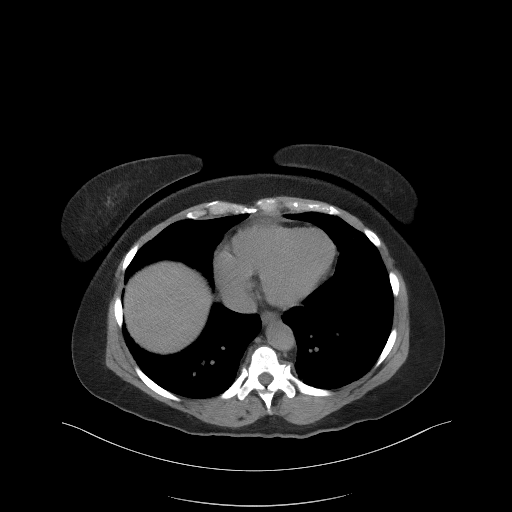

[Series 6: coronal st · coronal · 0.74mm/px · 3 of 101 slices shown]
[im 34/101  soft-tissue]
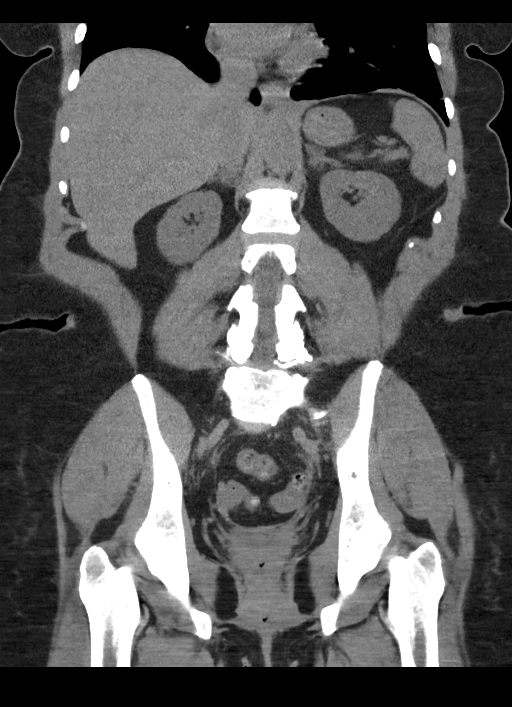
[im 45/101  soft-tissue]
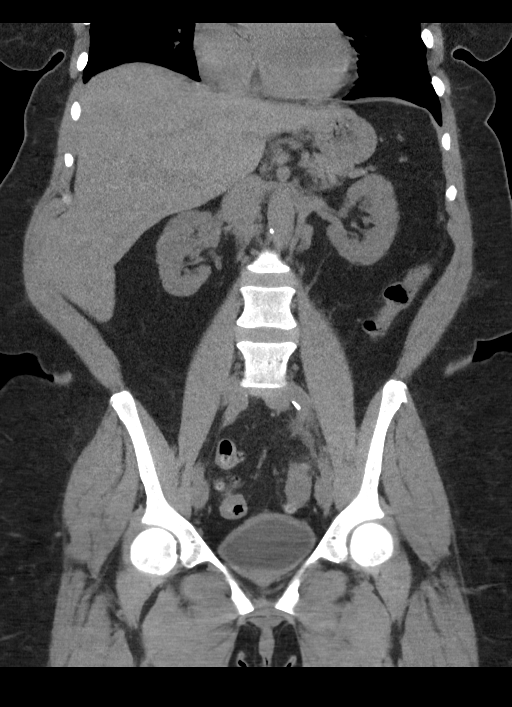
[im 56/101  soft-tissue]
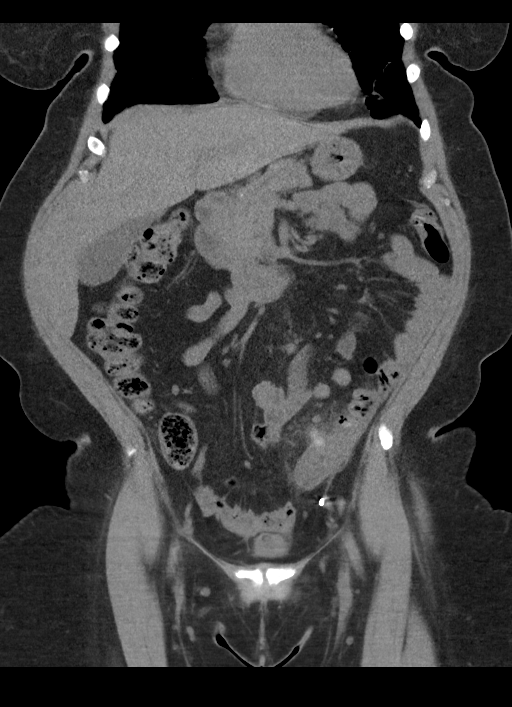

[14 of 46 positions shown; findings below may reference images not displayed]

FINDINGS: Lower chest: Dependent atelectasis. Normal cardiac size. Small
pericardial effusion.

Hepatobiliary: No worrisome focal liver lesions. Smooth liver
surface contour. Normal hepatic attenuation. Normal gallbladder and
biliary tree.

Pancreas: No pancreatic ductal dilatation or surrounding
inflammatory changes.

Spleen: Normal in size. No concerning splenic lesions.

Adrenals/Urinary Tract: Low-attenuation nodule at the tip of the
medial limb right adrenal gland most compatible with benign adrenal
adenoma (0 HU). No concerning adrenal nodules or masses. No visible
or contour deforming renal lesion. No urolithiasis or
hydronephrosis. Urinary bladder is unremarkable for the degree of
distention.

Stomach/Bowel: Distal esophagus, stomach and duodenal sweep are
unremarkable. No small bowel wall thickening or dilatation. No
evidence of obstruction. A normal appendix is visualized. Proximal
colon is unremarkable. Segmental thickening of the proximal sigmoid
colon with surrounding inflammation and phlegmonous changes centered
upon a culprit diverticulum (6/55). Surrounding mesenteric stranding
and peritoneal thickening as well as some adjacent reactive
appearing lymph nodes. More distal colonic segments are
unremarkable. Additional scattered diverticula elsewhere without
other site of focal inflammation.

Vascular/Lymphatic: Atherosclerotic calcifications within the
abdominal aorta and branch vessels. No aneurysm or ectasia.
Scattered prominent nodes seen in the left lower quadrant adjacent
the inflamed portion of sigmoid colon, favored to be reactive. No
pathologically enlarged nodes in the abdomen or pelvis.

Reproductive: None

Other: No abdominopelvic free air or fluid. Phlegmon and
inflammation with peritoneal thickening adjacent the inflamed
sigmoid colon, as above.

Musculoskeletal: Multilevel degenerative changes are present in the
imaged portions of the spine. Grade 1 anterolisthesis L4 on 5
without spondylolysis. Maximal discogenic and facet degenerative
changes at this level as well with some moderate severe canal and
foraminal narrowing. No acute osseous abnormality or suspicious
osseous lesion.
IMPRESSION: Colonic diverticulosis with features compatible with acute
diverticulitis involving a segment of proximal sigmoid colon with
surrounding inflammatory change in phlegmon as well as peritoneal
thickening and reactive appearing adenopathy. No resulting
obstruction. No evidence of perforation or abscess formation.

Grade 1 anterolisthesis L4 on L5 with maximal discogenic and facet
degenerative changes at this level resulting in some moderate to
severe canal and foraminal narrowing.

Low-attenuation nodule in the medial limb right adrenal gland most
compatible with benign adrenal adenoma on this unenhanced CT.

Aortic Atherosclerosis ([R3]-[R3]).

## 2020-09-15 MED ORDER — MORPHINE SULFATE (PF) 2 MG/ML IV SOLN
2.0000 mg | Freq: Once | INTRAVENOUS | Status: AC
  Administered 2020-09-16: 2 mg via INTRAVENOUS
  Filled 2020-09-15: qty 1

## 2020-09-15 MED ORDER — ENOXAPARIN SODIUM 100 MG/ML IJ SOSY
1.0000 mg/kg | PREFILLED_SYRINGE | Freq: Once | INTRAMUSCULAR | Status: AC
  Administered 2020-09-15: 100 mg via SUBCUTANEOUS
  Filled 2020-09-15: qty 1

## 2020-09-15 MED ORDER — FENTANYL CITRATE (PF) 100 MCG/2ML IJ SOLN
50.0000 ug | Freq: Once | INTRAMUSCULAR | Status: AC
  Administered 2020-09-15: 50 ug via INTRAVENOUS
  Filled 2020-09-15: qty 2

## 2020-09-15 MED ORDER — SODIUM CHLORIDE 0.9 % IV BOLUS
1000.0000 mL | Freq: Once | INTRAVENOUS | Status: AC
  Administered 2020-09-15: 1000 mL via INTRAVENOUS

## 2020-09-15 MED ORDER — SODIUM CHLORIDE 0.9 % IV SOLN
2.0000 g | Freq: Once | INTRAVENOUS | Status: AC
  Administered 2020-09-15: 2 g via INTRAVENOUS
  Filled 2020-09-15: qty 2

## 2020-09-15 MED ORDER — ONDANSETRON HCL 4 MG/2ML IJ SOLN
4.0000 mg | Freq: Once | INTRAMUSCULAR | Status: AC
  Administered 2020-09-15: 4 mg via INTRAVENOUS
  Filled 2020-09-15: qty 2

## 2020-09-15 MED ORDER — ALBUTEROL SULFATE HFA 108 (90 BASE) MCG/ACT IN AERS
4.0000 | INHALATION_SPRAY | Freq: Once | RESPIRATORY_TRACT | Status: AC
  Administered 2020-09-15: 4 via RESPIRATORY_TRACT
  Filled 2020-09-15: qty 6.7

## 2020-09-15 MED ORDER — METRONIDAZOLE 500 MG/100ML IV SOLN
500.0000 mg | Freq: Once | INTRAVENOUS | Status: AC
  Administered 2020-09-15: 500 mg via INTRAVENOUS
  Filled 2020-09-15: qty 100

## 2020-09-15 NOTE — ED Notes (Signed)
Patient assisted to bedside commode.

## 2020-09-15 NOTE — Plan of Care (Signed)
[facility] will assume care on arrival to accepting facility. Until arrival, care as per EDP. However, [facility] available 24/7 for questions and assistance.  

## 2020-09-15 NOTE — ED Notes (Signed)
Gave spouse coffee

## 2020-09-15 NOTE — ED Notes (Signed)
Hospitalist has not returned call to PA. [facility] aware and reported would page hospitalist again. PA aware.

## 2020-09-15 NOTE — ED Notes (Signed)
Per RN Ailene Ravel ok for pt to have something to eat. Pt has decided on a healthy choice meat loaf with mash potatoes, corn and apple with ginger ale to drink

## 2020-09-15 NOTE — ED Notes (Signed)
hospitalist consult paged per carelink at [phone]. PA aware.

## 2020-09-15 NOTE — ED Provider Notes (Addendum)
[facility] EMERGENCY DEPARTMENT Provider Note   CSN: [id number] Arrival date & time: 09/15/20  1508     History Chief Complaint  Patient presents with   Abdominal Pain    Rose Ross is a 55 y.o. female.  HPI   Pt is a 55 y/o female with a h/o DM, HTN, who presents to the ED today for eval of llq abd pain. States pain has been waxing and waning for a month. She has had associated intermittent diarrhea, fevers, and chills. She states symptoms improved for some time earlier this month but has not recurred. Pain is sharp in nature and is severe. Denies diarrhea currently. She does report associated nausea and chills.   Has had colonoscopy that confirmed diverticulosis.   Past Medical History:  Diagnosis Date   Diabetes mellitus    Hypertension     Patient Active Problem List   Diagnosis Date Noted   Diverticulitis 09/15/2020    Past Surgical History:  Procedure Laterality Date   ABDOMINAL HYSTERECTOMY     OOPHORECTOMY       OB History   No obstetric history on file.     History reviewed. No pertinent family history.  Social History   Tobacco Use   Smoking status: Former  Substance Use Topics   Alcohol use: No   Drug use: No    Home Medications Prior to Admission medications   Medication Sig Start Date End Date Taking? Authorizing Provider  amLODipine (NORVASC) 10 MG tablet Take 10 mg by mouth daily.      [provider]  gabapentin (NEURONTIN) 100 MG capsule Take 1 capsule (100 mg total) by mouth 3 (three) times daily. 06/08/11 06/07/12  Julianne Rice, MD  metFORMIN (GLUMETZA) 1000 MG (MOD) 24 hr tablet Take 1,000 mg by mouth 2 (two) times daily with a meal.      [provider]  metoprolol (TOPROL-XL) 100 MG 24 hr tablet Take 100 mg by mouth daily.      [provider]  telmisartan (MICARDIS) 40 MG tablet Take 40 mg by mouth daily.      [provider]  traMADol (ULTRAM) 50 MG tablet Take 50 mg by mouth  every 6 (six) hours as needed for pain.    [provider]    Allergies    Sulfa antibiotics  Review of Systems   Review of Systems  Constitutional:  Positive for chills. Negative for fever.  HENT:  Negative for ear pain and sore throat.   Eyes:  Negative for pain and visual disturbance.  Respiratory:  Negative for cough and shortness of breath.   Cardiovascular:  Negative for chest pain and palpitations.  Gastrointestinal:  Positive for abdominal pain, diarrhea and nausea. Negative for constipation and vomiting.  Genitourinary:  Negative for dysuria and hematuria.  Musculoskeletal:  Negative for back pain.  Skin:  Negative for color change and rash.  Neurological:  Negative for seizures and syncope.  All other systems reviewed and are negative.  Physical Exam Updated Vital Signs BP 123/78   Pulse 89   Temp 99.5 F (37.5 C) (Oral)   Resp 19   Ht '5\' 6"'$  (1.676 m)   Wt 99.8 kg   SpO2 94%   BMI 35.51 kg/m   Physical Exam Vitals and nursing note reviewed.  Constitutional:      General: She is not in acute distress.    Appearance: She is well-developed.  HENT:     Head: Normocephalic and atraumatic.  Eyes:     Conjunctiva/sclera: Conjunctivae normal.  Cardiovascular:     Rate and Rhythm: Normal rate and regular rhythm.     Heart sounds: Normal heart sounds. No murmur heard. Pulmonary:     Effort: Pulmonary effort is normal. No respiratory distress.     Breath sounds: Normal breath sounds. No wheezing, rhonchi or rales.  Abdominal:     General: Bowel sounds are normal.     Palpations: Abdomen is soft.     Tenderness: There is abdominal tenderness in the left lower quadrant. There is guarding.  Musculoskeletal:     Cervical back: Neck supple.  Skin:    General: Skin is warm and dry.  Neurological:     Mental Status: She is alert.    ED Results / Procedures / Treatments   Labs (all labs ordered are listed, but only abnormal results are displayed) Labs  Reviewed  CBC WITH DIFFERENTIAL/PLATELET - Abnormal; Notable for the following components:      Result Value   WBC 12.9 (*)    MCV 76.7 (*)    MCH 25.2 (*)    RDW 16.8 (*)    Neutro Abs 10.1 (*)    Monocytes Absolute 1.1 (*)    Abs Immature Granulocytes 0.08 (*)    All other components within normal limits  COMPREHENSIVE METABOLIC PANEL - Abnormal; Notable for the following components:   BUN 24 (*)    Creatinine, Ser 2.10 (*)    GFR, Estimated 27 (*)    All other components within normal limits  URINALYSIS, ROUTINE W REFLEX MICROSCOPIC - Abnormal; Notable for the following components:   Glucose, UA 250 (*)    Protein, ur 100 (*)    All other components within normal limits  URINALYSIS, MICROSCOPIC (REFLEX) - Abnormal; Notable for the following components:   Bacteria, UA FEW (*)    All other components within normal limits  D-DIMER, QUANTITATIVE - Abnormal; Notable for the following components:   D-Dimer, Quant 1.36 (*)    All other components within normal limits  RESP PANEL BY RT-PCR (FLU A&B, COVID) ARPGX2  LIPASE, BLOOD  BRAIN NATRIURETIC PEPTIDE    EKG None  Radiology CT ABDOMEN PELVIS WO CONTRAST  Result Date: 09/15/2020 CLINICAL DATA:  Diverticulitis suspected, left lower quadrant pain for 1 month EXAM: CT ABDOMEN AND PELVIS WITHOUT CONTRAST TECHNIQUE: Multidetector CT imaging of the abdomen and pelvis was performed following the standard protocol without IV contrast. COMPARISON:  None. FINDINGS: Lower chest: Dependent atelectasis. Normal cardiac size. Small pericardial effusion. Hepatobiliary: No worrisome focal liver lesions. Smooth liver surface contour. Normal hepatic attenuation. Normal gallbladder and biliary tree. Pancreas: No pancreatic ductal dilatation or surrounding inflammatory changes. Spleen: Normal in size. No concerning splenic lesions. Adrenals/Urinary Tract: Low-attenuation nodule at the tip of the medial limb right adrenal gland most compatible with  benign adrenal adenoma (0 HU). No concerning adrenal nodules or masses. No visible or contour deforming renal lesion. No urolithiasis or hydronephrosis. Urinary bladder is unremarkable for the degree of distention. Stomach/Bowel: Distal esophagus, stomach and duodenal sweep are unremarkable. No small bowel wall thickening or dilatation. No evidence of obstruction. A normal appendix is visualized. Proximal colon is unremarkable. Segmental thickening of the proximal sigmoid colon with surrounding inflammation and phlegmonous changes centered upon a culprit diverticulum (6/55). Surrounding mesenteric stranding and peritoneal thickening as well as some adjacent reactive appearing lymph nodes. More distal colonic segments are unremarkable. Additional scattered diverticula elsewhere without other site of focal inflammation. Vascular/Lymphatic: Atherosclerotic  calcifications within the abdominal aorta and branch vessels. No aneurysm or ectasia. Scattered prominent nodes seen in the left lower quadrant adjacent the inflamed portion of sigmoid colon, favored to be reactive. No pathologically enlarged nodes in the abdomen or pelvis. Reproductive: None Other: No abdominopelvic free air or fluid. Phlegmon and inflammation with peritoneal thickening adjacent the inflamed sigmoid colon, as above. Musculoskeletal: Multilevel degenerative changes are present in the imaged portions of the spine. Grade 1 anterolisthesis L4 on 5 without spondylolysis. Maximal discogenic and facet degenerative changes at this level as well with some moderate severe canal and foraminal narrowing. No acute osseous abnormality or suspicious osseous lesion. IMPRESSION: Colonic diverticulosis with features compatible with acute diverticulitis involving a segment of proximal sigmoid colon with surrounding inflammatory change in phlegmon as well as peritoneal thickening and reactive appearing adenopathy. No resulting obstruction. No evidence of perforation or  abscess formation. Grade 1 anterolisthesis L4 on L5 with maximal discogenic and facet degenerative changes at this level resulting in some moderate to severe canal and foraminal narrowing. Low-attenuation nodule in the medial limb right adrenal gland most compatible with benign adrenal adenoma on this unenhanced CT. Aortic Atherosclerosis (ICD10-I70.0). Electronically Signed   By: Lovena Le M.D.   On: 09/15/2020 19:36   DG Chest 2 View  Result Date: 09/15/2020 CLINICAL DATA:  Shortness of breath EXAM: CHEST - 2 VIEW COMPARISON:  12/27/2019 FINDINGS: The heart size and mediastinal contours are within normal limits. Mild streaky bibasilar atelectasis. No pleural effusion or pneumothorax. Relative increased density projecting within the posterior aspect of a lower thoracic vertebral body is artifactual as there is no correlate abnormality on the concurrently obtained CT abdomen/pelvis. No acute osseous abnormality. IMPRESSION: Mild bibasilar atelectasis. Electronically Signed   By: Davina Poke D.O.   On: 09/15/2020 19:42    Procedures Procedures   CRITICAL CARE Performed by: Rodney Booze   Total critical care time: 32 minutes  Critical care time was exclusive of separately billable procedures and treating other patients.  Critical care was necessary to treat or prevent imminent or life-threatening deterioration.  Critical care was time spent personally by me on the following activities: development of treatment plan with patient and/or surrogate as well as nursing, discussions with consultants, evaluation of patient's response to treatment, examination of patient, obtaining history from patient or surrogate, ordering and performing treatments and interventions, ordering and review of laboratory studies, ordering and review of radiographic studies, pulse oximetry and re-evaluation of patient's condition.   Medications Ordered in ED Medications  morphine 2 MG/ML injection 2 mg (has no  administration in time range)  sodium chloride 0.9 % bolus 1,000 mL ( Intravenous Stopped 09/15/20 1942)  ondansetron (ZOFRAN) injection 4 mg (4 mg Intravenous Given 09/15/20 1812)  fentaNYL (SUBLIMAZE) injection 50 mcg (50 mcg Intravenous Given 09/15/20 1813)  albuterol (VENTOLIN HFA) 108 (90 Base) MCG/ACT inhaler 4 puff (4 puffs Inhalation Given 09/15/20 1831)  ceFEPIme (MAXIPIME) 2 g in sodium chloride 0.9 % 100 mL IVPB (0 g Intravenous Stopped 09/15/20 2038)    And  metroNIDAZOLE (FLAGYL) IVPB 500 mg (0 mg Intravenous Stopped 09/15/20 2152)  fentaNYL (SUBLIMAZE) injection 50 mcg (50 mcg Intravenous Given 09/15/20 2121)  enoxaparin (LOVENOX) injection 100 mg (100 mg Subcutaneous Given 09/15/20 2319)    ED Course  I have reviewed the triage vital signs and the nursing notes.  Pertinent labs & imaging results that were available during my care of the patient were reviewed by me and considered in my  medical decision making (see chart for details).    MDM Rules/Calculators/A&P                          55 y/o F presents for eval of lower abdominal pain ongoing for a month.  Reviewed/interpreted labs CBC shows a mild leukocytosis, no anemia CMP with elevated BUN/creatinine, on comparison of prior labs this appears to be her current baseline Lipase is negative BNP is normal UA shows glucosuria and proteinuria COVID test negative  Reviewed/interpreted imaging CXR - Mild bibasilar atelectasis.  CT abd/pelvis - Colonic diverticulosis with features compatible with acute diverticulitis involving a segment of proximal sigmoid colon with surrounding inflammatory change in phlegmon as well as peritoneal thickening and reactive appearing adenopathy. No resulting obstruction. No evidence of perforation or abscess formation. Grade 1 anterolisthesis L4 on L5 with maximal discogenic and facet degenerative changes at this level resulting in some moderate to severe canal and foraminal narrowing.  Low-attenuation nodule in the medial limb right adrenal gland most compatible with benign adrenal adenoma on this unenhanced CT. Aortic Atherosclerosis   While in the ED patient developed hypoxia.  She is not particularly short of breath and is denying any chest pain at this time but she does admit to a recent drive to [place] 2 weeks ago.  Her chest x-ray does not show any evidence of pneumonia, there is concern for PE though patient's renal function would not support a CTA with contrast.  We will admit for further treatment of her diverticulitis and further investigation of her hypoxia.  9:28 PM CONSULT with Dr. Marlowe Sax who accepts patient for admission at [facility].  She recommends ordering a D-dimer at this time.  If D-dimer is positive would order anticoagulation and feel patient can undergo a VQ scan.  Ddimer elevated and pt given a dose of lovenox  Final Clinical Impression(s) / ED Diagnoses Final diagnoses:  Diverticulitis  Hypoxia    Rx / DC Orders ED Discharge Orders     None        Rodney Booze, PA-C 09/15/20 2336    Rodney Booze, PA-C 09/15/20 2337    Dorie Rank, MD 09/17/20 1426

## 2020-09-15 NOTE — ED Triage Notes (Signed)
LLQ pain intermittent x 1 month, states chills, denies any N/V. Denies any urinary symptoms, normal BM

## 2020-09-16 ENCOUNTER — Encounter (HOSPITAL_COMMUNITY): Payer: Self-pay | Admitting: Internal Medicine

## 2020-09-16 ENCOUNTER — Inpatient Hospital Stay (HOSPITAL_COMMUNITY): Payer: BC Managed Care – PPO

## 2020-09-16 DIAGNOSIS — N184 Chronic kidney disease, stage 4 (severe): Secondary | ICD-10-CM | POA: Diagnosis present

## 2020-09-16 DIAGNOSIS — Z7984 Long term (current) use of oral hypoglycemic drugs: Secondary | ICD-10-CM | POA: Diagnosis not present

## 2020-09-16 DIAGNOSIS — K5792 Diverticulitis of intestine, part unspecified, without perforation or abscess without bleeding: Secondary | ICD-10-CM

## 2020-09-16 DIAGNOSIS — E785 Hyperlipidemia, unspecified: Secondary | ICD-10-CM | POA: Diagnosis present

## 2020-09-16 DIAGNOSIS — Z79891 Long term (current) use of opiate analgesic: Secondary | ICD-10-CM | POA: Diagnosis not present

## 2020-09-16 DIAGNOSIS — I129 Hypertensive chronic kidney disease with stage 1 through stage 4 chronic kidney disease, or unspecified chronic kidney disease: Secondary | ICD-10-CM | POA: Diagnosis present

## 2020-09-16 DIAGNOSIS — K5732 Diverticulitis of large intestine without perforation or abscess without bleeding: Secondary | ICD-10-CM | POA: Diagnosis present

## 2020-09-16 DIAGNOSIS — Z96652 Presence of left artificial knee joint: Secondary | ICD-10-CM | POA: Diagnosis present

## 2020-09-16 DIAGNOSIS — Z882 Allergy status to sulfonamides status: Secondary | ICD-10-CM | POA: Diagnosis not present

## 2020-09-16 DIAGNOSIS — J432 Centrilobular emphysema: Secondary | ICD-10-CM | POA: Diagnosis present

## 2020-09-16 DIAGNOSIS — I10 Essential (primary) hypertension: Secondary | ICD-10-CM | POA: Diagnosis present

## 2020-09-16 DIAGNOSIS — E1122 Type 2 diabetes mellitus with diabetic chronic kidney disease: Secondary | ICD-10-CM | POA: Diagnosis present

## 2020-09-16 DIAGNOSIS — Z90710 Acquired absence of both cervix and uterus: Secondary | ICD-10-CM | POA: Diagnosis not present

## 2020-09-16 DIAGNOSIS — Z87891 Personal history of nicotine dependence: Secondary | ICD-10-CM | POA: Diagnosis not present

## 2020-09-16 DIAGNOSIS — Z79899 Other long term (current) drug therapy: Secondary | ICD-10-CM | POA: Diagnosis not present

## 2020-09-16 DIAGNOSIS — E119 Type 2 diabetes mellitus without complications: Secondary | ICD-10-CM

## 2020-09-16 DIAGNOSIS — K760 Fatty (change of) liver, not elsewhere classified: Secondary | ICD-10-CM | POA: Diagnosis present

## 2020-09-16 DIAGNOSIS — J9601 Acute respiratory failure with hypoxia: Secondary | ICD-10-CM | POA: Diagnosis present

## 2020-09-16 DIAGNOSIS — N289 Disorder of kidney and ureter, unspecified: Secondary | ICD-10-CM

## 2020-09-16 DIAGNOSIS — R0902 Hypoxemia: Secondary | ICD-10-CM

## 2020-09-16 DIAGNOSIS — Z20822 Contact with and (suspected) exposure to covid-19: Secondary | ICD-10-CM | POA: Diagnosis present

## 2020-09-16 DIAGNOSIS — Z9079 Acquired absence of other genital organ(s): Secondary | ICD-10-CM | POA: Diagnosis not present

## 2020-09-16 LAB — GLUCOSE, CAPILLARY
Glucose-Capillary: 101 mg/dL — ABNORMAL HIGH (ref 70–99)
Glucose-Capillary: 103 mg/dL — ABNORMAL HIGH (ref 70–99)
Glucose-Capillary: 106 mg/dL — ABNORMAL HIGH (ref 70–99)
Glucose-Capillary: 114 mg/dL — ABNORMAL HIGH (ref 70–99)
Glucose-Capillary: 130 mg/dL — ABNORMAL HIGH (ref 70–99)
Glucose-Capillary: 158 mg/dL — ABNORMAL HIGH (ref 70–99)

## 2020-09-16 LAB — BASIC METABOLIC PANEL
Anion gap: 7 (ref 5–15)
BUN: 23 mg/dL — ABNORMAL HIGH (ref 6–20)
CO2: 25 mmol/L (ref 22–32)
Calcium: 8.7 mg/dL — ABNORMAL LOW (ref 8.9–10.3)
Chloride: 107 mmol/L (ref 98–111)
Creatinine, Ser: 2.02 mg/dL — ABNORMAL HIGH (ref 0.44–1.00)
GFR, Estimated: 28 mL/min — ABNORMAL LOW (ref >60)
Glucose, Bld: 102 mg/dL — ABNORMAL HIGH (ref 70–99)
Potassium: 3.9 mmol/L (ref 3.5–5.1)
Sodium: 139 mmol/L (ref 135–145)

## 2020-09-16 LAB — CBC
HCT: 35.6 % — ABNORMAL LOW (ref 36.0–46.0)
Hemoglobin: 11.4 g/dL — ABNORMAL LOW (ref 12.0–15.0)
MCH: 25.1 pg — ABNORMAL LOW (ref 26.0–34.0)
MCHC: 32 g/dL (ref 30.0–36.0)
MCV: 78.4 fL — ABNORMAL LOW (ref 80.0–100.0)
Platelets: 332 10*3/uL (ref 150–400)
RBC: 4.54 MIL/uL (ref 3.87–5.11)
RDW: 17 % — ABNORMAL HIGH (ref 11.5–15.5)
WBC: 10.7 10*3/uL — ABNORMAL HIGH (ref 4.0–10.5)
nRBC: 0 % (ref 0.0–0.2)

## 2020-09-16 LAB — HIV ANTIBODY (ROUTINE TESTING W REFLEX): HIV Screen 4th Generation wRfx: NONREACTIVE

## 2020-09-16 MED ORDER — METOPROLOL SUCCINATE ER 100 MG PO TB24: 100.0000 mg | ORAL_TABLET | Freq: Every day | ORAL | Status: DC

## 2020-09-16 MED ORDER — AMLODIPINE BESYLATE 10 MG PO TABS
10.0000 mg | ORAL_TABLET | Freq: Every day | ORAL | Status: DC
  Administered 2020-09-16 – 2020-09-17 (×2): 10 mg via ORAL
  Filled 2020-09-16 (×2): qty 1

## 2020-09-16 MED ORDER — PANTOPRAZOLE SODIUM 40 MG IV SOLR
40.0000 mg | INTRAVENOUS | Status: DC
  Administered 2020-09-16 – 2020-09-17 (×2): 40 mg via INTRAVENOUS
  Filled 2020-09-16 (×2): qty 40

## 2020-09-16 MED ORDER — ALBUTEROL SULFATE (2.5 MG/3ML) 0.083% IN NEBU: 3.0000 mL | INHALATION_SOLUTION | RESPIRATORY_TRACT | Status: DC | PRN

## 2020-09-16 MED ORDER — ONDANSETRON HCL 4 MG PO TABS: 4.0000 mg | ORAL_TABLET | Freq: Four times a day (QID) | ORAL | Status: DC | PRN

## 2020-09-16 MED ORDER — ENOXAPARIN SODIUM 40 MG/0.4ML IJ SOSY
40.0000 mg | PREFILLED_SYRINGE | INTRAMUSCULAR | Status: DC
  Administered 2020-09-16: 40 mg via SUBCUTANEOUS
  Filled 2020-09-16: qty 0.4

## 2020-09-16 MED ORDER — METRONIDAZOLE 500 MG/100ML IV SOLN
500.0000 mg | Freq: Three times a day (TID) | INTRAVENOUS | Status: DC
  Administered 2020-09-16 – 2020-09-17 (×5): 500 mg via INTRAVENOUS
  Filled 2020-09-16 (×5): qty 100

## 2020-09-16 MED ORDER — SODIUM CHLORIDE 0.9 % IV SOLN
2.0000 g | INTRAVENOUS | Status: DC
  Administered 2020-09-16 – 2020-09-17 (×2): 2 g via INTRAVENOUS
  Filled 2020-09-16 (×2): qty 2
  Filled 2020-09-16 (×2): qty 20

## 2020-09-16 MED ORDER — ONDANSETRON HCL 4 MG/2ML IJ SOLN: 4.0000 mg | Freq: Four times a day (QID) | INTRAMUSCULAR | Status: DC | PRN

## 2020-09-16 MED ORDER — LACTATED RINGERS IV SOLN: INTRAVENOUS | Status: DC

## 2020-09-16 MED ORDER — ATORVASTATIN CALCIUM 10 MG PO TABS
10.0000 mg | ORAL_TABLET | Freq: Every day | ORAL | Status: DC
  Administered 2020-09-16 – 2020-09-17 (×2): 10 mg via ORAL
  Filled 2020-09-16 (×2): qty 1

## 2020-09-16 MED ORDER — MORPHINE SULFATE (PF) 2 MG/ML IV SOLN: 2.0000 mg | INTRAVENOUS | Status: DC | PRN

## 2020-09-16 MED ORDER — ACETAMINOPHEN 650 MG RE SUPP: 650.0000 mg | Freq: Four times a day (QID) | RECTAL | Status: DC | PRN

## 2020-09-16 MED ORDER — ACETAMINOPHEN 325 MG PO TABS
650.0000 mg | ORAL_TABLET | Freq: Four times a day (QID) | ORAL | Status: DC | PRN
  Administered 2020-09-16 – 2020-09-17 (×2): 650 mg via ORAL
  Filled 2020-09-16 (×2): qty 2

## 2020-09-16 MED ORDER — METOPROLOL TARTRATE 50 MG PO TABS
100.0000 mg | ORAL_TABLET | Freq: Two times a day (BID) | ORAL | Status: DC
  Administered 2020-09-16 – 2020-09-17 (×3): 100 mg via ORAL
  Filled 2020-09-16 (×3): qty 2

## 2020-09-16 MED ORDER — TECHNETIUM TO 99M ALBUMIN AGGREGATED
4.3900 | Freq: Once | INTRAVENOUS | Status: AC
  Administered 2020-09-16: 4.39 via INTRAVENOUS

## 2020-09-16 MED ORDER — INSULIN ASPART 100 UNIT/ML IJ SOLN
0.0000 [IU] | INTRAMUSCULAR | Status: DC
  Administered 2020-09-16: 1 [IU] via SUBCUTANEOUS
  Administered 2020-09-16: 2 [IU] via SUBCUTANEOUS

## 2020-09-16 NOTE — ED Notes (Signed)
Assisted patient to bedside commode

## 2020-09-16 NOTE — H&P (Signed)
History and Physical    Rose Ross [id number] DOB: 10-26-1965 DOA: 09/15/2020  PCP: Pcp, No  Patient coming from: Home  I have personally briefly reviewed patient's old medical records in [place]  Chief Complaint: Abd pain  HPI: Rose Ross is a 55 y.o. 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  Pt presents to the ED at [facility] with c/o LLQ abd pain for about the past month.  Waxing and waning during that period.  Intermittent diarrhea, fevers and chills.  Pain is sharp and severe.  H/o diverticulosis on colonoscopy.  Pt reports having diverticulitis before in the past to me.  No CP, no SOB (despite new O2 requirement).   ED Course: Pt with diverticulitis on CT scan.  tM 100.1, wbc 12.9k.  Creat of 2.1 (2.0 in Feb).  Pt also has new O2 requirement, satting just below 90% on RA reportedly.  She does not normally wear oxygen.  D.Dimer of 1.3.  Pt given single dose of [place] lovenox.  Pt transferred to [facility] for admission.   Review of Systems: As per HPI, otherwise all review of systems negative.  Past Medical History:  Diagnosis Date   Centrilobular emphysema (HCC)    CKD (chronic kidney disease) stage 4, GFR 15-29 ml/min (HCC)    Hypertensive nephropathy   Diabetes mellitus    Fatty liver    HLD (hyperlipidemia)    Hypertension     Past Surgical History:  Procedure Laterality Date   ABDOMINAL HYSTERECTOMY     OOPHORECTOMY     TOTAL KNEE ARTHROPLASTY Left 2021     reports that she has quit smoking. She does not have any smokeless tobacco history on file. She reports that she does not drink alcohol and does not use drugs.  Allergies  Allergen Reactions   Sulfa Antibiotics     Family History  Problem Relation Age of Onset   Diverticulitis Neg Hx      Prior to Admission medications   Medication Sig Start Date End Date Taking? Authorizing Provider  amLODipine  (NORVASC) 10 MG tablet Take 10 mg by mouth daily.      [provider]  gabapentin (NEURONTIN) 100 MG capsule Take 1 capsule (100 mg total) by mouth 3 (three) times daily. 06/08/11 06/07/12  Julianne Rice, MD  metFORMIN (GLUMETZA) 1000 MG (MOD) 24 hr tablet Take 1,000 mg by mouth 2 (two) times daily with a meal.      [provider]  metoprolol (TOPROL-XL) 100 MG 24 hr tablet Take 100 mg by mouth daily.      [provider]  telmisartan (MICARDIS) 40 MG tablet Take 40 mg by mouth daily.      [provider]  traMADol (ULTRAM) 50 MG tablet Take 50 mg by mouth every 6 (six) hours as needed for pain.    [provider]    Physical Exam: Vitals:   09/16/20 0030 09/16/20 0100 09/16/20 0112 09/16/20 0200  BP: 118/77 113/74 113/73 115/71  Pulse: 86 86 89 81  Resp: '17 16 14 18  '$ Temp:  99.3 F (37.4 C)  100.1 F (37.8 C)  TempSrc:  Oral  Oral  SpO2: 92% 92% 92% 96%  Weight:      Height:        Constitutional: NAD, calm, comfortable, sleepy Eyes: PERRL, lids and conjunctivae normal ENMT: Mucous membranes are moist. Posterior pharynx clear of any  exudate or lesions.Normal dentition.  Neck: normal, supple, no masses, no thyromegaly Respiratory: clear to auscultation bilaterally, no wheezing, no crackles. Normal respiratory effort. No accessory muscle use.  Cardiovascular: Regular rate and rhythm, no murmurs / rubs / gallops. No extremity edema. 2+ pedal pulses. No carotid bruits.  Abdomen: Mild TTP LLQ Musculoskeletal: no clubbing / cyanosis. No joint deformity upper and lower extremities. Good ROM, no contractures. Normal muscle tone.  Skin: no rashes, lesions, ulcers. No induration Neurologic: CN 2-12 grossly intact. Sensation intact, DTR normal. Strength 5/5 in all 4.  Psychiatric: Normal judgment and insight. Alert and oriented x 3. Normal mood.    Labs on Admission: I have personally reviewed following labs and imaging studies  CBC: Recent  Labs  Lab 09/15/20 1806 09/16/20 0508  WBC 12.9* 10.7*  NEUTROABS 10.1*  --   HGB 12.7 11.4*  HCT 38.6 35.6*  MCV 76.7* 78.4*  PLT 361 [id number]   Basic Metabolic Panel: Recent Labs  Lab 09/15/20 1806  NA 137  K 4.2  CL 100  CO2 26  GLUCOSE 97  BUN 24*  CREATININE 2.10*  CALCIUM 9.2   GFR: Estimated Creatinine Clearance: 35.2 mL/min (A) (by C-G formula based on SCr of 2.1 mg/dL (H)). Liver Function Tests: Recent Labs  Lab 09/15/20 1806  AST 17  ALT 15  ALKPHOS 63  BILITOT 0.6  PROT 7.6  ALBUMIN 3.7   Recent Labs  Lab 09/15/20 1806  LIPASE 41   No results for input(s): AMMONIA in the last 168 hours. Coagulation Profile: No results for input(s): INR, PROTIME in the last 168 hours. Cardiac Enzymes: No results for input(s): CKTOTAL, CKMB, CKMBINDEX, TROPONINI in the last 168 hours. BNP (last 3 results) No results for input(s): PROBNP in the last 8760 hours. HbA1C: No results for input(s): HGBA1C in the last 72 hours. CBG: Recent Labs  Lab 09/16/20 0417  GLUCAP 114*   Lipid Profile: No results for input(s): CHOL, HDL, LDLCALC, TRIG, CHOLHDL, LDLDIRECT in the last 72 hours. Thyroid Function Tests: No results for input(s): TSH, T4TOTAL, FREET4, T3FREE, THYROIDAB in the last 72 hours. Anemia Panel: No results for input(s): VITAMINB12, FOLATE, FERRITIN, TIBC, IRON, RETICCTPCT in the last 72 hours. Urine analysis:    Component Value Date/Time   COLORURINE YELLOW 09/15/2020 1900   APPEARANCEUR CLEAR 09/15/2020 1900   LABSPEC 1.010 09/15/2020 1900   PHURINE 6.0 09/15/2020 1900   GLUCOSEU 250 (A) 09/15/2020 1900   HGBUR NEGATIVE 09/15/2020 1900   BILIRUBINUR NEGATIVE 09/15/2020 1900   KETONESUR NEGATIVE 09/15/2020 1900   PROTEINUR 100 (A) 09/15/2020 1900   UROBILINOGEN 0.2 12/18/2012 0951   NITRITE NEGATIVE 09/15/2020 1900   LEUKOCYTESUR NEGATIVE 09/15/2020 1900    Radiological Exams on Admission: CT ABDOMEN PELVIS WO CONTRAST  Result Date:  09/15/2020 CLINICAL DATA:  Diverticulitis suspected, left lower quadrant pain for 1 month EXAM: CT ABDOMEN AND PELVIS WITHOUT CONTRAST TECHNIQUE: Multidetector CT imaging of the abdomen and pelvis was performed following the standard protocol without IV contrast. COMPARISON:  None. FINDINGS: Lower chest: Dependent atelectasis. Normal cardiac size. Small pericardial effusion. Hepatobiliary: No worrisome focal liver lesions. Smooth liver surface contour. Normal hepatic attenuation. Normal gallbladder and biliary tree. Pancreas: No pancreatic ductal dilatation or surrounding inflammatory changes. Spleen: Normal in size. No concerning splenic lesions. Adrenals/Urinary Tract: Low-attenuation nodule at the tip of the medial limb right adrenal gland most compatible with benign adrenal adenoma (0 HU). No concerning adrenal nodules or masses. No visible or contour deforming renal lesion.  No urolithiasis or hydronephrosis. Urinary bladder is unremarkable for the degree of distention. Stomach/Bowel: Distal esophagus, stomach and duodenal sweep are unremarkable. No small bowel wall thickening or dilatation. No evidence of obstruction. A normal appendix is visualized. Proximal colon is unremarkable. Segmental thickening of the proximal sigmoid colon with surrounding inflammation and phlegmonous changes centered upon a culprit diverticulum (6/55). Surrounding mesenteric stranding and peritoneal thickening as well as some adjacent reactive appearing lymph nodes. More distal colonic segments are unremarkable. Additional scattered diverticula elsewhere without other site of focal inflammation. Vascular/Lymphatic: Atherosclerotic calcifications within the abdominal aorta and branch vessels. No aneurysm or ectasia. Scattered prominent nodes seen in the left lower quadrant adjacent the inflamed portion of sigmoid colon, favored to be reactive. No pathologically enlarged nodes in the abdomen or pelvis. Reproductive: None Other: No  abdominopelvic free air or fluid. Phlegmon and inflammation with peritoneal thickening adjacent the inflamed sigmoid colon, as above. Musculoskeletal: Multilevel degenerative changes are present in the imaged portions of the spine. Grade 1 anterolisthesis L4 on 5 without spondylolysis. Maximal discogenic and facet degenerative changes at this level as well with some moderate severe canal and foraminal narrowing. No acute osseous abnormality or suspicious osseous lesion. IMPRESSION: Colonic diverticulosis with features compatible with acute diverticulitis involving a segment of proximal sigmoid colon with surrounding inflammatory change in phlegmon as well as peritoneal thickening and reactive appearing adenopathy. No resulting obstruction. No evidence of perforation or abscess formation. Grade 1 anterolisthesis L4 on L5 with maximal discogenic and facet degenerative changes at this level resulting in some moderate to severe canal and foraminal narrowing. Low-attenuation nodule in the medial limb right adrenal gland most compatible with benign adrenal adenoma on this unenhanced CT. Aortic Atherosclerosis (ICD10-I70.0). Electronically Signed   By: Lovena Le M.D.   On: 09/15/2020 19:36   DG Chest 2 View  Result Date: 09/15/2020 CLINICAL DATA:  Shortness of breath EXAM: CHEST - 2 VIEW COMPARISON:  12/27/2019 FINDINGS: The heart size and mediastinal contours are within normal limits. Mild streaky bibasilar atelectasis. No pleural effusion or pneumothorax. Relative increased density projecting within the posterior aspect of a lower thoracic vertebral body is artifactual as there is no correlate abnormality on the concurrently obtained CT abdomen/pelvis. No acute osseous abnormality. IMPRESSION: Mild bibasilar atelectasis. Electronically Signed   By: Davina Poke D.O.   On: 09/15/2020 19:42    EKG: Independently reviewed.  Assessment/Plan Principal Problem:   Diverticulitis Active Problems:   Acute  respiratory failure with hypoxia (HCC)   CKD (chronic kidney disease) stage 4, GFR 15-29 ml/min (HCC)   HTN (hypertension)   DM2 (diabetes mellitus, type 2) (HCC)    Diverticulitis - Rocephin + Flagyl IVF, hold home diuretics Will keep NPO for bowel rest for the moment Repeat CBC in AM Acute resp failure with hypoxia - Pt with new O2 requirement, doesn't wear oxygen at baseline. CXR neg Getting VQ scan to R/O DVT Got single dose of [place] lovenox at [facility] Pt asymptomatic with respect to this however even before coming in to ED: I wonder if Pt might not just have undiagnosed chronic resp failure with hypoxia secondary to centrilobular emphysema (the later is on her chart at [facility]). Accelerated HTN - Pt not sure of all the BP meds she is on, just that it is "a lot". Pt confirms she takes Norvasc, metoprolol Will order these at dosing on the [facility] chart Isnt sure about other meds and dosing Need pharmacy to complete med rec so these can be  ordered CKD 4 - Secondary to hypertension Holding home diuretics given NPO status Looks like creat today is about baseline (was 2.0 in Feb this year). Follows with nephrology in [facility] DM2 - Hold Farxiga Sensitive SSI Q4H  DVT prophylaxis: Lovenox (just DVT ppx dosing for now, got full dose x1 dose in ED while we await VQ scan) Code Status: Full Family Communication: No family in room Disposition Plan: Home after hypoxia work up and improvement in diverticulitis Consults called: None Admission status: Admit to inpatient  Severity of Illness: The appropriate patient status for this patient is INPATIENT. Inpatient status is judged to be reasonable and necessary in order to provide the required intensity of service to ensure the patient's safety. The patient's presenting symptoms, physical exam findings, and initial radiographic and laboratory data in the context of their chronic comorbidities is felt to place them at high risk for further  clinical deterioration. Furthermore, it is not anticipated that the patient will be medically stable for discharge from the hospital within 2 midnights of admission. The following factors support the patient status of inpatient.   Patient has acute respiratory failure with hypoxia due to having a new oxygen requirement.  That is the patient has a PaO2 < 60 (pulse Ox < 90%) on room air.   * I certify that at the point of admission it is my clinical judgment that the patient will require inpatient hospital care spanning beyond 2 midnights from the point of admission due to high intensity of service, high risk for further deterioration and high frequency of surveillance required.*   Pamila Mendibles M. DO [facility]  How to contact the [facility] Attending or Consulting provider [place] or covering provider during after hours [place], for this patient?  Check the care team in [facility] and look for a) attending/consulting [facility] provider listed and b) the [facility] team listed Log into [email or web address]  Amion Physician Scheduling and messaging for groups and whole hospitals  On call and physician scheduling software for group practices, residents, hospitalists and other medical providers for call, clinic, rotation and shift schedules. OnCall [employer] is a hospital-wide system for scheduling doctors and paging doctors on call. EasyPlot is for scientific plotting and data analysis.  [email or web address]  and use [place]'s universal password to access. If you do not have the password, please contact the hospital operator.  Locate the [facility] provider you are looking for under [facility] and page to a number that you can be directly reached. If you still have difficulty reaching the provider, please page the [facility] (Director on Call) for the Hospitalists listed on amion for assistance.  09/16/2020, 5:36 AM

## 2020-09-16 NOTE — ED Notes (Signed)
[facility] called for transport. 

## 2020-09-16 NOTE — Progress Notes (Signed)
55 year old lady with history of hypertension and CKD stage IV presented with abdominal pain and was diagnosed with acute diverticulitis.  Patient seen and examined.  She states that her pain is slightly better.  Mostly pain is located at the left lower quadrant.  Denies any shortness of breath.  On exam she has rhonchi at the bases bilaterally.  VQ scan is negative for PE.  Her acute hypoxic respiratory failure is likely secondary to atelectasis due to her trying to avoid deep breaths due to worsening abdominal pain.  On abdominal examination, most prominent tenderness that she has is at the left lower quadrant but also around periumbilical area.  She has no nausea and wants to eat.  Will start on clears and advance to full liquid diet.  Continue current antibiotics.  Provided and encouraged incentive spirometry.  Repeat labs in the morning.

## 2020-09-17 ENCOUNTER — Encounter (HOSPITAL_COMMUNITY): Payer: Self-pay | Admitting: Internal Medicine

## 2020-09-17 DIAGNOSIS — E1122 Type 2 diabetes mellitus with diabetic chronic kidney disease: Secondary | ICD-10-CM | POA: Diagnosis not present

## 2020-09-17 DIAGNOSIS — J9601 Acute respiratory failure with hypoxia: Secondary | ICD-10-CM

## 2020-09-17 DIAGNOSIS — N184 Chronic kidney disease, stage 4 (severe): Secondary | ICD-10-CM | POA: Diagnosis not present

## 2020-09-17 DIAGNOSIS — K5792 Diverticulitis of intestine, part unspecified, without perforation or abscess without bleeding: Secondary | ICD-10-CM | POA: Diagnosis not present

## 2020-09-17 DIAGNOSIS — I10 Essential (primary) hypertension: Secondary | ICD-10-CM

## 2020-09-17 LAB — COMPREHENSIVE METABOLIC PANEL
ALT: 15 U/L (ref 0–44)
AST: 14 U/L — ABNORMAL LOW (ref 15–41)
Albumin: 3.4 g/dL — ABNORMAL LOW (ref 3.5–5.0)
Alkaline Phosphatase: 54 U/L (ref 38–126)
Anion gap: 12 (ref 5–15)
BUN: 19 mg/dL (ref 6–20)
CO2: 28 mmol/L (ref 22–32)
Calcium: 9.5 mg/dL (ref 8.9–10.3)
Chloride: 103 mmol/L (ref 98–111)
Creatinine, Ser: 1.81 mg/dL — ABNORMAL HIGH (ref 0.44–1.00)
GFR, Estimated: 32 mL/min — ABNORMAL LOW (ref >60)
Glucose, Bld: 101 mg/dL — ABNORMAL HIGH (ref 70–99)
Potassium: 4 mmol/L (ref 3.5–5.1)
Sodium: 143 mmol/L (ref 135–145)
Total Bilirubin: 0.5 mg/dL (ref 0.3–1.2)
Total Protein: 7.3 g/dL (ref 6.5–8.1)

## 2020-09-17 LAB — CBC WITH DIFFERENTIAL/PLATELET
Abs Immature Granulocytes: 0.03 10*3/uL (ref 0.00–0.07)
Basophils Absolute: 0 10*3/uL (ref 0.0–0.1)
Basophils Relative: 0 %
Eosinophils Absolute: 0.2 10*3/uL (ref 0.0–0.5)
Eosinophils Relative: 3 %
HCT: 38.6 % (ref 36.0–46.0)
Hemoglobin: 12.3 g/dL (ref 12.0–15.0)
Immature Granulocytes: 0 %
Lymphocytes Relative: 15 %
Lymphs Abs: 1 10*3/uL (ref 0.7–4.0)
MCH: 24.9 pg — ABNORMAL LOW (ref 26.0–34.0)
MCHC: 31.9 g/dL (ref 30.0–36.0)
MCV: 78.1 fL — ABNORMAL LOW (ref 80.0–100.0)
Monocytes Absolute: 0.7 10*3/uL (ref 0.1–1.0)
Monocytes Relative: 10 %
Neutro Abs: 5 10*3/uL (ref 1.7–7.7)
Neutrophils Relative %: 72 %
Platelets: 337 10*3/uL (ref 150–400)
RBC: 4.94 MIL/uL (ref 3.87–5.11)
RDW: 16.8 % — ABNORMAL HIGH (ref 11.5–15.5)
WBC: 7.1 10*3/uL (ref 4.0–10.5)
nRBC: 0 % (ref 0.0–0.2)

## 2020-09-17 LAB — GLUCOSE, CAPILLARY
Glucose-Capillary: 89 mg/dL (ref 70–99)
Glucose-Capillary: 92 mg/dL (ref 70–99)
Glucose-Capillary: 112 mg/dL — ABNORMAL HIGH (ref 70–99)

## 2020-09-17 LAB — MAGNESIUM: Magnesium: 2 mg/dL (ref 1.7–2.4)

## 2020-09-17 LAB — HEMOGLOBIN A1C
Hgb A1c MFr Bld: 6.8 % — ABNORMAL HIGH (ref 4.8–5.6)
Mean Plasma Glucose: 148 mg/dL

## 2020-09-17 LAB — PHOSPHORUS: Phosphorus: 3.1 mg/dL (ref 2.5–4.6)

## 2020-09-17 MED ORDER — PANTOPRAZOLE SODIUM 40 MG PO TBEC: 40.0000 mg | DELAYED_RELEASE_TABLET | Freq: Every day | ORAL | Status: DC

## 2020-09-17 MED ORDER — AMOXICILLIN-POT CLAVULANATE 875-125 MG PO TABS: 1.0000 | ORAL_TABLET | Freq: Two times a day (BID) | ORAL | 0 refills | Status: AC

## 2020-09-17 MED ORDER — ACETAMINOPHEN 325 MG PO TABS: 650.0000 mg | ORAL_TABLET | Freq: Four times a day (QID) | ORAL | 0 refills | Status: AC | PRN

## 2020-09-17 MED ORDER — ONDANSETRON HCL 4 MG PO TABS: 4.0000 mg | ORAL_TABLET | Freq: Four times a day (QID) | ORAL | 0 refills | Status: DC | PRN

## 2020-09-17 NOTE — Plan of Care (Signed)

## 2020-09-17 NOTE — Progress Notes (Signed)
The patient is receiving Protonix by the intravenous route.  Based on criteria approved by the Pharmacy and [place], the medication is being converted to the equivalent oral dose form.  These criteria include: -No active GI bleeding -Able to tolerate diet of full liquids (or better) or tube feeding -Able to tolerate other medications by the oral or enteral route  If you have any questions about this conversion, please contact the Pharmacy Department (phone [phone]).  Thank you.   Minda Ditto PharmD [facility] [phone] 09/17/2020, 12:27 PM

## 2020-09-17 NOTE — Plan of Care (Signed)
  Problem: Education: Goal: Knowledge of General Education information will improve Description: Including pain rating scale, medication(s)/side effects and non-pharmacologic comfort measures 09/17/2020 1457 by Lennie Hummer, RN Outcome: Adequate for Discharge 09/17/2020 1411 by Lennie Hummer, RN Outcome: Progressing   Problem: Activity: Goal: Risk for activity intolerance will decrease Outcome: Adequate for Discharge   Problem: Nutrition: Goal: Adequate nutrition will be maintained 09/17/2020 1457 by Lennie Hummer, RN Outcome: Adequate for Discharge 09/17/2020 1411 by Lennie Hummer, RN Outcome: Progressing   Problem: Coping: Goal: Level of anxiety will decrease Outcome: Adequate for Discharge   Problem: Pain Managment: Goal: General experience of comfort will improve 09/17/2020 1457 by Lennie Hummer, RN Outcome: Adequate for Discharge 09/17/2020 1411 by Lennie Hummer, RN Outcome: Progressing   Problem: Safety: Goal: Ability to remain free from injury will improve 09/17/2020 1457 by Lennie Hummer, RN Outcome: Adequate for Discharge 09/17/2020 1411 by Lennie Hummer, RN Outcome: Progressing

## 2020-09-17 NOTE — Discharge Summary (Signed)
Physician Discharge Summary  Rose Ross [id number] DOB: March 08, 1965 DOA: 09/15/2020  PCP: Pcp, No  Admit date: 09/15/2020 Discharge date: 09/17/2020  Admitted From: Home Disposition:  Home  Recommendations for Outpatient Follow-up:  Follow up with PCP in 1-2 weeks Follow up with General Surgery in the outpatient setting  Please obtain CMP/CBC, Mag, Phos in one week Please follow up on the following pending results:  Home Health: No Equipment/Devices: None    Discharge Condition: Stable  CODE STATUS: FULL CODE  Diet recommendation: Soft Heart Healthy Carb Modified Diet  Brief/Interim Summary: HPI per Dr. Jennette Kettle Rose Ross is a 55 y.o. 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   Pt presents to the ED at [facility] with c/o LLQ abd pain for about the past month.  Waxing and waning during that period.  Intermittent diarrhea, fevers and chills.  Pain is sharp and severe.  H/o diverticulosis on colonoscopy.  Pt reports having diverticulitis before in the past to me.   No CP, no SOB (despite new O2 requirement).     ED Course: Pt with diverticulitis on CT scan.  tM 100.1, wbc 12.9k.   Creat of 2.1 (2.0 in Feb).   Pt also has new O2 requirement, satting just below 90% on RA reportedly.  She does not normally wear oxygen.   D.Dimer of 1.3.  Pt given single dose of [place] lovenox.   Pt transferred to [facility] for admission.  **Subsequently she was improving and tolerated her diet advancement without abdominal pain and ambulated without desaturating.  She was changed to p.o. antibiotics and discharged home in stable condition  Discharge Diagnoses:  Principal Problem:   Diverticulitis Active Problems:   Acute respiratory failure with hypoxia (HCC)   CKD (chronic kidney disease) stage 4, GFR 15-29 ml/min (HCC)   HTN (hypertension)   DM2 (diabetes mellitus, type 2) (HCC)  Acute Diverticulitis   Rocephin + Flagyl while hospitalized and changed to p.o. Augmentin at discharge IVF, hold home diuretics Will keep NPO for bowel rest for the moment and then subsequently placed on the clinical diet and then transition to a soft diet Repeat CBC in AM showed no leukocytosis and improvement  Acute resp failure with hypoxia - Pt with new O2 requirement, doesn't wear oxygen at baseline. CXR neg Getting VQ scan to R/O PE and this was negative Got single dose of [place] lovenox at Med center She refused further Lovenox doses Pt asymptomatic with respect to this however even before coming in to ED: I wonder if Pt might not just have undiagnosed chronic resp failure with hypoxia secondary to centrilobular emphysema (the later is on her chart at [facility]). Subsequently she ambulated without desaturating and was discharged home  Accelerated HTN  Pt not sure of all the BP meds she is on, just that it is "a lot". Pt confirms she takes Norvasc, metoprolol Will order these at dosing on the [facility] chart Isnt sure about other meds and dosing Need pharmacy to complete med rec so these can be ordered     Diabetes Mellitus Type 2 -Holding Home Meds of Farxiga -HbA1c was 6.8 this Admission    Elevated D-Dimer -D-Dimer was 1.36 -Checked a V/Q Scan given Renal Fxn and showed no perfusion defects to suggest pulmonary emboli -Placed on Sq  Lovenox and patient refused this AM    Microcytic Anemia/Anemia of Chronic Kidney Disease -Patient's Hgb/Hct went from 12.7/38.6 ->  11.4/35.6 and this a.m. was 12.3/38.6; MCV is now 78.1 -Check Anemia Panel i as an outpatient -Continue to Monitor for S/Sx of Bleeding; No overt Bleeding noted -Repeat CBC within 1 week   CKD Stage 4 -Chronic and Stable; In the setting of Hypertension and Diabetes Mellitus Type 2 -Patient's BUN/Cr went from 24/2.10 -> 23/2.02 and further improved to 19/1.81 -Avoid further nephrotoxic medications, contrast dyes, hypotension and renally adjust  medications  -Continue monitor and trend renal function carefully and repeat CMP within 1 week   Obesity -Complicates overall prognosis and care -Estimated body mass index is 36.86 kg/m as calculated from the following:   Height as of this encounter: '5\' 6"'$  (1.676 m).   Weight as of this encounter: 103.6 kg. -Weight Loss and Dietary Counseling given   Discharge Instructions  Discharge Instructions     Call MD for:  difficulty breathing, headache or visual disturbances   Complete by: As directed    Call MD for:  extreme fatigue   Complete by: As directed    Call MD for:  hives   Complete by: As directed    Call MD for:  persistant dizziness or light-headedness   Complete by: As directed    Call MD for:  persistant nausea and vomiting   Complete by: As directed    Call MD for:  redness, tenderness, or signs of infection (pain, swelling, redness, odor or green/yellow discharge around incision site)   Complete by: As directed    Call MD for:  severe uncontrolled pain   Complete by: As directed    Call MD for:  temperature >100.4   Complete by: As directed    Diet - low sodium heart healthy   Complete by: As directed    Diet Carb Modified   Complete by: As directed    Discharge instructions   Complete by: As directed    You were cared for by a hospitalist during your hospital stay. If you have any questions about your discharge medications or the care you received while you were in the hospital after you are discharged, you can call the unit and ask to speak with the hospitalist on call if the hospitalist that took care of you is not available. Once you are discharged, your primary care physician will handle any further medical issues. Please note that NO REFILLS for any discharge medications will be authorized once you are discharged, as it is imperative that you return to your primary care physician (or establish a relationship with a primary care physician if you do not have one)  for your aftercare needs so that they can reassess your need for medications and monitor your lab values.  Follow up with PCP and Gastroenterology within 1-2 weeks. Take all medications as prescribed. If symptoms change or worsen please return to the ED for evaluation   Increase activity slowly   Complete by: As directed       Allergies as of 09/17/2020       Reactions   Sulfa Antibiotics         Medication List     TAKE these medications    acetaminophen 325 MG tablet Commonly known as: TYLENOL Take 2 tablets (650 mg total) by mouth every 6 (six) hours as needed for mild pain (or Fever >/= 101).   albuterol 108 (90 Base) MCG/ACT inhaler Commonly known as: VENTOLIN HFA Inhale 2 puffs into the lungs every 4 (four) hours as needed for wheezing.   amLODipine  10 MG tablet Commonly known as: NORVASC Take 10 mg by mouth daily.   amoxicillin-clavulanate 875-125 MG tablet Commonly known as: Augmentin Take 1 tablet by mouth every 12 (twelve) hours for 10 days.   atorvastatin 10 MG tablet Commonly known as: LIPITOR Take 5 mg by mouth at bedtime.   Farxiga 10 MG Tabs tablet Generic drug: dapagliflozin propanediol Take 10 mg by mouth daily.   fluticasone 50 MCG/ACT nasal spray Commonly known as: FLONASE Place 1 spray into the nose daily as needed for allergies.   furosemide 20 MG tablet Commonly known as: LASIX Take 20 mg by mouth daily.   gabapentin 300 MG capsule Commonly known as: NEURONTIN Take 300 mg by mouth at bedtime.   hydrALAZINE 50 MG tablet Commonly known as: APRESOLINE Take 50 mg by mouth 2 (two) times daily.   HYDROcodone-acetaminophen 10-325 MG tablet Commonly known as: NORCO Take 1 tablet by mouth 4 (four) times daily as needed for moderate pain.   losartan 100 MG tablet Commonly known as: COZAAR Take 100 mg by mouth daily.   metoprolol tartrate 100 MG tablet Commonly known as: LOPRESSOR Take 100 mg by mouth 2 (two) times daily.   omeprazole  20 MG capsule Commonly known as: PRILOSEC Take 20 mg by mouth every morning.   ondansetron 4 MG tablet Commonly known as: ZOFRAN Take 1 tablet (4 mg total) by mouth every 6 (six) hours as needed for nausea.   Vitamin D (Ergocalciferol) 1.25 MG (50000 UNIT) Caps capsule Commonly known as: DRISDOL Take 50,000 Units by mouth every Monday.        Allergies  Allergen Reactions   Sulfa Antibiotics    Consultations: None  Procedures/Studies: CT ABDOMEN PELVIS WO CONTRAST  Result Date: 09/15/2020 CLINICAL DATA:  Diverticulitis suspected, left lower quadrant pain for 1 month EXAM: CT ABDOMEN AND PELVIS WITHOUT CONTRAST TECHNIQUE: Multidetector CT imaging of the abdomen and pelvis was performed following the standard protocol without IV contrast. COMPARISON:  None. FINDINGS: Lower chest: Dependent atelectasis. Normal cardiac size. Small pericardial effusion. Hepatobiliary: No worrisome focal liver lesions. Smooth liver surface contour. Normal hepatic attenuation. Normal gallbladder and biliary tree. Pancreas: No pancreatic ductal dilatation or surrounding inflammatory changes. Spleen: Normal in size. No concerning splenic lesions. Adrenals/Urinary Tract: Low-attenuation nodule at the tip of the medial limb right adrenal gland most compatible with benign adrenal adenoma (0 HU). No concerning adrenal nodules or masses. No visible or contour deforming renal lesion. No urolithiasis or hydronephrosis. Urinary bladder is unremarkable for the degree of distention. Stomach/Bowel: Distal esophagus, stomach and duodenal sweep are unremarkable. No small bowel wall thickening or dilatation. No evidence of obstruction. A normal appendix is visualized. Proximal colon is unremarkable. Segmental thickening of the proximal sigmoid colon with surrounding inflammation and phlegmonous changes centered upon a culprit diverticulum (6/55). Surrounding mesenteric stranding and peritoneal thickening as well as some adjacent  reactive appearing lymph nodes. More distal colonic segments are unremarkable. Additional scattered diverticula elsewhere without other site of focal inflammation. Vascular/Lymphatic: Atherosclerotic calcifications within the abdominal aorta and branch vessels. No aneurysm or ectasia. Scattered prominent nodes seen in the left lower quadrant adjacent the inflamed portion of sigmoid colon, favored to be reactive. No pathologically enlarged nodes in the abdomen or pelvis. Reproductive: None Other: No abdominopelvic free air or fluid. Phlegmon and inflammation with peritoneal thickening adjacent the inflamed sigmoid colon, as above. Musculoskeletal: Multilevel degenerative changes are present in the imaged portions of the spine. Grade 1 anterolisthesis L4 on 5 without spondylolysis.  Maximal discogenic and facet degenerative changes at this level as well with some moderate severe canal and foraminal narrowing. No acute osseous abnormality or suspicious osseous lesion. IMPRESSION: Colonic diverticulosis with features compatible with acute diverticulitis involving a segment of proximal sigmoid colon with surrounding inflammatory change in phlegmon as well as peritoneal thickening and reactive appearing adenopathy. No resulting obstruction. No evidence of perforation or abscess formation. Grade 1 anterolisthesis L4 on L5 with maximal discogenic and facet degenerative changes at this level resulting in some moderate to severe canal and foraminal narrowing. Low-attenuation nodule in the medial limb right adrenal gland most compatible with benign adrenal adenoma on this unenhanced CT. Aortic Atherosclerosis (ICD10-I70.0). Electronically Signed   By: Lovena Le M.D.   On: 09/15/2020 19:36   DG Chest 2 View  Result Date: 09/15/2020 CLINICAL DATA:  Shortness of breath EXAM: CHEST - 2 VIEW COMPARISON:  12/27/2019 FINDINGS: The heart size and mediastinal contours are within normal limits. Mild streaky bibasilar atelectasis.  No pleural effusion or pneumothorax. Relative increased density projecting within the posterior aspect of a lower thoracic vertebral body is artifactual as there is no correlate abnormality on the concurrently obtained CT abdomen/pelvis. No acute osseous abnormality. IMPRESSION: Mild bibasilar atelectasis. Electronically Signed   By: Davina Poke D.O.   On: 09/15/2020 19:42   NM Pulmonary Perfusion  Result Date: 09/16/2020 CLINICAL DATA:  Pulmonary emboli suspected, low/intermediate probability. Positive D-dimer. Shortness of breath over the last several months. EXAM: NUCLEAR MEDICINE PERFUSION LUNG SCAN TECHNIQUE: Perfusion images were obtained in multiple projections after intravenous injection of radiopharmaceutical. Ventilation scans intentionally deferred if perfusion scan and chest x-ray adequate for interpretation during COVID 19 epidemic. RADIOPHARMACEUTICALS:  4.39 mCi Tc-6mMAA IV COMPARISON:  Chest radiography yesterday. FINDINGS: No segmental or subsegmental defects to suggest pulmonary emboli. IMPRESSION: No perfusion defects to suggest pulmonary emboli. Electronically Signed   By: MNelson ChimesM.D.   On: 09/16/2020 11:31     Subjective: Seen and examined at bedside and abdominal pain improved and she tolerated diet without issues.  Ambulated and did not desaturate.  She was deemed stable for discharge home and changed to p.o. antibiotics with Augmentin.  She will need to follow-up with her PCP and follow-up with general surgeon outpatient setting given that this is her third or fourth recurrent diverticulitis.  Discharge Exam: Vitals:   09/16/20 2035 09/17/20 0514  BP: 125/73 132/78  Pulse: 88 82  Resp: 16 18  Temp: 98 F (36.7 C) 98.3 F (36.8 C)  SpO2: 92% 94%   Vitals:   09/16/20 1225 09/16/20 2035 09/17/20 0514 09/17/20 0524  BP: 116/71 125/73 132/78   Pulse: 82 88 82   Resp:  16 18   Temp: 98.6 F (37 C) 98 F (36.7 C) 98.3 F (36.8 C)   TempSrc: Oral Oral Oral    SpO2: 93% 92% 94%   Weight:    103.6 kg  Height:       General: Pt is alert, awake, not in acute distress Cardiovascular: RRR, S1/S2 +, no rubs, no gallops Respiratory: Diminished bilaterally, no wheezing, no rhonchi; unlabored breathing and not wearing supplemental oxygen via nasal cannula Abdominal: Soft, NT, distended secondary body habitus, bowel sounds + Extremities: no edema, no cyanosis  The results of significant diagnostics from this hospitalization (including imaging, microbiology, ancillary and laboratory) are listed below for reference.    Microbiology: Recent Results (from the past 240 hour(s))  Resp Panel by RT-PCR (Flu A&B, Covid) Nasopharyngeal Swab  Status: None   Collection Time: 09/15/20  7:00 PM   Specimen: Nasopharyngeal Swab; Nasopharyngeal(NP) swabs in vial transport medium  Result Value Ref Range Status   SARS Coronavirus 2 by RT PCR NEGATIVE NEGATIVE Final    Comment: (NOTE) SARS-CoV-2 target nucleic acids are NOT DETECTED.  The SARS-CoV-2 RNA is generally detectable in upper respiratory specimens during the acute phase of infection. The lowest concentration of SARS-CoV-2 viral copies this assay can detect is 138 copies/mL. A negative result does not preclude SARS-Cov-2 infection and should not be used as the sole basis for treatment or other patient management decisions. A negative result may occur with  improper specimen collection/handling, submission of specimen other than nasopharyngeal swab, presence of viral mutation(s) within the areas targeted by this assay, and inadequate number of viral copies(<138 copies/mL). A negative result must be combined with clinical observations, patient history, and epidemiological information. The expected result is Negative.  Fact Sheet for Patients:  EntrepreneurPulse.com.au  Fact Sheet for Healthcare Providers:  IncredibleEmployment.be  This test is no t yet approved or  cleared by the [place] FDA and  has been authorized for detection and/or diagnosis of SARS-CoV-2 by FDA under an Emergency Use Authorization (EUA). This EUA will remain  in effect (meaning this test can be used) for the duration of the COVID-19 declaration under Section 564(b)(1) of the Act, 21 U.S.C.section 360bbb-3(b)(1), unless the authorization is terminated  or revoked sooner.       Influenza A by PCR NEGATIVE NEGATIVE Final   Influenza B by PCR NEGATIVE NEGATIVE Final    Comment: (NOTE) The Xpert Xpress SARS-CoV-2/FLU/RSV plus assay is intended as an aid in the diagnosis of influenza from Nasopharyngeal swab specimens and should not be used as a sole basis for treatment. Nasal washings and aspirates are unacceptable for Xpert Xpress SARS-CoV-2/FLU/RSV testing.  Fact Sheet for Patients: EntrepreneurPulse.com.au  Fact Sheet for Healthcare Providers: IncredibleEmployment.be  This test is not yet approved or cleared by the [place] FDA and has been authorized for detection and/or diagnosis of SARS-CoV-2 by FDA under an Emergency Use Authorization (EUA). This EUA will remain in effect (meaning this test can be used) for the duration of the COVID-19 declaration under Section 564(b)(1) of the Act, 21 U.S.C. section 360bbb-3(b)(1), unless the authorization is terminated or revoked.  Performed at [facility], [address] [place], [place] [address]     Labs: BNP (last 3 results) Recent Labs    09/15/20 1806  BNP [id number]   Basic Metabolic Panel: Recent Labs  Lab 09/15/20 1806 09/16/20 0508 09/17/20 0840  NA 137 139 143  K 4.2 3.9 4.0  CL 100 107 103  CO2 '26 25 28  '$ GLUCOSE 97 102* 101*  BUN 24* 23* 19  CREATININE 2.10* 2.02* 1.81*  CALCIUM 9.2 8.7* 9.5  MG  --   --  2.0  PHOS  --   --  3.1   Liver Function Tests: Recent Labs  Lab 09/15/20 1806 09/17/20 0840  AST 17 14*  ALT 15 15  ALKPHOS 63 54   BILITOT 0.6 0.5  PROT 7.6 7.3  ALBUMIN 3.7 3.4*   Recent Labs  Lab 09/15/20 1806  LIPASE 41   No results for input(s): AMMONIA in the last 168 hours. CBC: Recent Labs  Lab 09/15/20 1806 09/16/20 0508 09/17/20 0840  WBC 12.9* 10.7* 7.1  NEUTROABS 10.1*  --  5.0  HGB 12.7 11.4* 12.3  HCT 38.6 35.6* 38.6  MCV  76.7* 78.4* 78.1*  PLT 361 332 337   Cardiac Enzymes: No results for input(s): CKTOTAL, CKMB, CKMBINDEX, TROPONINI in the last 168 hours. BNP: Invalid input(s): POCBNP CBG: Recent Labs  Lab 09/16/20 2039 09/16/20 2349 09/17/20 0358 09/17/20 0730 09/17/20 1104  GLUCAP 106* 103* 89 92 112*   D-Dimer Recent Labs    09/15/20 2156  DDIMER 1.36*   Hgb A1c Recent Labs    09/16/20 0508  HGBA1C 6.8*   Lipid Profile No results for input(s): CHOL, HDL, LDLCALC, TRIG, CHOLHDL, LDLDIRECT in the last 72 hours. Thyroid function studies No results for input(s): TSH, T4TOTAL, T3FREE, THYROIDAB in the last 72 hours.  Invalid input(s): FREET3 Anemia work up No results for input(s): VITAMINB12, FOLATE, FERRITIN, TIBC, IRON, RETICCTPCT in the last 72 hours. Urinalysis    Component Value Date/Time   COLORURINE YELLOW 09/15/2020 1900   APPEARANCEUR CLEAR 09/15/2020 1900   LABSPEC 1.010 09/15/2020 1900   PHURINE 6.0 09/15/2020 1900   GLUCOSEU 250 (A) 09/15/2020 1900   HGBUR NEGATIVE 09/15/2020 1900   BILIRUBINUR NEGATIVE 09/15/2020 1900   KETONESUR NEGATIVE 09/15/2020 1900   PROTEINUR 100 (A) 09/15/2020 1900   UROBILINOGEN 0.2 12/18/2012 0951   NITRITE NEGATIVE 09/15/2020 1900   LEUKOCYTESUR NEGATIVE 09/15/2020 1900   Sepsis Labs Invalid input(s): PROCALCITONIN,  WBC,  LACTICIDVEN Microbiology Recent Results (from the past 240 hour(s))  Resp Panel by RT-PCR (Flu A&B, Covid) Nasopharyngeal Swab     Status: None   Collection Time: 09/15/20  7:00 PM   Specimen: Nasopharyngeal Swab; Nasopharyngeal(NP) swabs in vial transport medium  Result Value Ref Range  Status   SARS Coronavirus 2 by RT PCR NEGATIVE NEGATIVE Final    Comment: (NOTE) SARS-CoV-2 target nucleic acids are NOT DETECTED.  The SARS-CoV-2 RNA is generally detectable in upper respiratory specimens during the acute phase of infection. The lowest concentration of SARS-CoV-2 viral copies this assay can detect is 138 copies/mL. A negative result does not preclude SARS-Cov-2 infection and should not be used as the sole basis for treatment or other patient management decisions. A negative result may occur with  improper specimen collection/handling, submission of specimen other than nasopharyngeal swab, presence of viral mutation(s) within the areas targeted by this assay, and inadequate number of viral copies(<138 copies/mL). A negative result must be combined with clinical observations, patient history, and epidemiological information. The expected result is Negative.  Fact Sheet for Patients:  EntrepreneurPulse.com.au  Fact Sheet for Healthcare Providers:  IncredibleEmployment.be  This test is no t yet approved or cleared by the [place] FDA and  has been authorized for detection and/or diagnosis of SARS-CoV-2 by FDA under an Emergency Use Authorization (EUA). This EUA will remain  in effect (meaning this test can be used) for the duration of the COVID-19 declaration under Section 564(b)(1) of the Act, 21 U.S.C.section 360bbb-3(b)(1), unless the authorization is terminated  or revoked sooner.       Influenza A by PCR NEGATIVE NEGATIVE Final   Influenza B by PCR NEGATIVE NEGATIVE Final    Comment: (NOTE) The Xpert Xpress SARS-CoV-2/FLU/RSV plus assay is intended as an aid in the diagnosis of influenza from Nasopharyngeal swab specimens and should not be used as a sole basis for treatment. Nasal washings and aspirates are unacceptable for Xpert Xpress SARS-CoV-2/FLU/RSV testing.  Fact Sheet for  Patients: EntrepreneurPulse.com.au  Fact Sheet for Healthcare Providers: IncredibleEmployment.be  This test is not yet approved or cleared by the [place] FDA and has been authorized for detection and/or  diagnosis of SARS-CoV-2 by FDA under an Emergency Use Authorization (EUA). This EUA will remain in effect (meaning this test can be used) for the duration of the COVID-19 declaration under Section 564(b)(1) of the Act, 21 U.S.C. section 360bbb-3(b)(1), unless the authorization is terminated or revoked.  Performed at [facility], [address] [place], [place] [address]    Time coordinating discharge: 35 minutes  SIGNED:  Kerney Elbe, DO [facility] 09/17/2020, 9:52 PM Pager is on Patrick  If 7PM-7AM, please contact night-coverage [email or web address]

## 2022-06-08 ENCOUNTER — Emergency Department (HOSPITAL_COMMUNITY): Payer: Medicaid Other

## 2022-06-08 ENCOUNTER — Encounter (HOSPITAL_COMMUNITY): Payer: Self-pay | Admitting: Emergency Medicine

## 2022-06-08 ENCOUNTER — Inpatient Hospital Stay (HOSPITAL_COMMUNITY)
Admission: EM | Admit: 2022-06-08 | Discharge: 2022-06-11 | DRG: 177 | Disposition: A | Discharge status: Home / Self Care | Payer: Medicaid Other | Attending: Internal Medicine | Admitting: Internal Medicine

## 2022-06-08 DIAGNOSIS — E860 Dehydration: Secondary | ICD-10-CM | POA: Diagnosis present

## 2022-06-08 DIAGNOSIS — J9601 Acute respiratory failure with hypoxia: Secondary | ICD-10-CM | POA: Diagnosis present

## 2022-06-08 DIAGNOSIS — Z87891 Personal history of nicotine dependence: Secondary | ICD-10-CM

## 2022-06-08 DIAGNOSIS — N184 Chronic kidney disease, stage 4 (severe): Secondary | ICD-10-CM | POA: Diagnosis present

## 2022-06-08 DIAGNOSIS — Z79899 Other long term (current) drug therapy: Secondary | ICD-10-CM

## 2022-06-08 DIAGNOSIS — E1122 Type 2 diabetes mellitus with diabetic chronic kidney disease: Secondary | ICD-10-CM | POA: Diagnosis present

## 2022-06-08 DIAGNOSIS — N179 Acute kidney failure, unspecified: Secondary | ICD-10-CM | POA: Diagnosis present

## 2022-06-08 DIAGNOSIS — I129 Hypertensive chronic kidney disease with stage 1 through stage 4 chronic kidney disease, or unspecified chronic kidney disease: Secondary | ICD-10-CM | POA: Diagnosis present

## 2022-06-08 DIAGNOSIS — Z882 Allergy status to sulfonamides status: Secondary | ICD-10-CM

## 2022-06-08 DIAGNOSIS — E669 Obesity, unspecified: Secondary | ICD-10-CM | POA: Diagnosis present

## 2022-06-08 DIAGNOSIS — E785 Hyperlipidemia, unspecified: Secondary | ICD-10-CM | POA: Diagnosis present

## 2022-06-08 DIAGNOSIS — Z6835 Body mass index (BMI) 35.0-35.9, adult: Secondary | ICD-10-CM

## 2022-06-08 DIAGNOSIS — U071 COVID-19: Principal | ICD-10-CM | POA: Diagnosis present

## 2022-06-08 DIAGNOSIS — R0902 Hypoxemia: Secondary | ICD-10-CM

## 2022-06-08 DIAGNOSIS — Z96652 Presence of left artificial knee joint: Secondary | ICD-10-CM | POA: Diagnosis present

## 2022-06-08 DIAGNOSIS — J432 Centrilobular emphysema: Secondary | ICD-10-CM | POA: Diagnosis present

## 2022-06-08 LAB — COMPREHENSIVE METABOLIC PANEL
ALT: 15 U/L (ref 0–44)
AST: 18 U/L (ref 15–41)
Albumin: 3.5 g/dL (ref 3.5–5.0)
Alkaline Phosphatase: 55 U/L (ref 38–126)
Anion gap: 10 (ref 5–15)
BUN: 34 mg/dL — ABNORMAL HIGH (ref 6–20)
CO2: 24 mmol/L (ref 22–32)
Calcium: 8.9 mg/dL (ref 8.9–10.3)
Chloride: 102 mmol/L (ref 98–111)
Creatinine, Ser: 3.3 mg/dL — ABNORMAL HIGH (ref 0.44–1.00)
GFR, Estimated: 16 mL/min — ABNORMAL LOW (ref >60)
Glucose, Bld: 96 mg/dL (ref 70–99)
Potassium: 4.3 mmol/L (ref 3.5–5.1)
Sodium: 136 mmol/L (ref 135–145)
Total Bilirubin: 0.6 mg/dL (ref 0.3–1.2)
Total Protein: 7.2 g/dL (ref 6.5–8.1)

## 2022-06-08 LAB — CBC
HCT: 37.1 % (ref 36.0–46.0)
Hemoglobin: 12.1 g/dL (ref 12.0–15.0)
MCH: 26.4 pg (ref 26.0–34.0)
MCHC: 32.6 g/dL (ref 30.0–36.0)
MCV: 81 fL (ref 80.0–100.0)
Platelets: 298 10*3/uL (ref 150–400)
RBC: 4.58 MIL/uL (ref 3.87–5.11)
RDW: 16.1 % — ABNORMAL HIGH (ref 11.5–15.5)
WBC: 5.4 10*3/uL (ref 4.0–10.5)
nRBC: 0 % (ref 0.0–0.2)

## 2022-06-08 LAB — LIPASE, BLOOD: Lipase: 63 U/L — ABNORMAL HIGH (ref 11–51)

## 2022-06-08 LAB — TROPONIN I (HIGH SENSITIVITY): Troponin I (High Sensitivity): 6 ng/L (ref <18)

## 2022-06-08 NOTE — ED Triage Notes (Addendum)
Doctors appt with PCP in morning. Fever was 100.3 and he gave her tylenol. Fever has resolved now so now husband thinking they may go home.  Pt has new chest pain and SOB today and dizzy. She reports feeling "foggy" and sleepy. Belly is tender to palpation.

## 2022-06-08 NOTE — ED Provider Triage Note (Signed)
Emergency Medicine Provider Triage Evaluation Note  Rose Ross , a 57 y.o. female  was evaluated in triage.  Pt presents to the ED with multiple complaints.  Husband states that patient came to the ED today because of a fever of 100.3 Fahrenheit.  Patient states that she has had this all day with generalized weakness.  She also notes recent left-sided chest pain and intermittent shortness of breath.  Also notes recent cough and congestion which she contributes to a dog allergy after being exposed this past weekend.  Also concerned that she may have a possible sinus infection.  Also complains of generalized abdominal pain.  Last bowel movement was yesterday was normal.  She does have a history of diverticulitis.  She has no localized pain to the left lower quadrant.  States last episode of this was 1 to 2 years ago for which she was admitted to the hospital.  She denies nausea, vomiting, diarrhea, or urinary symptoms.  Per husband, patient has a PCP appointment at 7 AM tomorrow morning and patient's fever has now resolved after taking 1 Tylenol at home.  He notes that he does not want to wait in the ED lobby for further evaluation.  Discussed with patient and husband that we are unable to rule out emergent condition without further workup in the ED with patient's many symptoms.  Patient agreeable to stay at this time.  Review of Systems  Positive: See HPI Negative: See HPI  Physical Exam  BP 127/83 (BP Location: Left Arm)   Pulse (!) 109   Temp 98.9 F (37.2 C) (Oral)   Resp 18   SpO2 90%  Gen:   Awake, no distress   Resp:  Normal effort, minimal expiratory wheezing to the right lower lung fields, questionable crackles, moving air well, no signs of respiratory distress MSK:   Moves extremities without difficulty  Other:  Mild diffuse abdominal tenderness, abdomen is soft, nondistended, and without rebound, guarding, or peritoneal signs, no CVA tenderness; alert and oriented, cranial nerves  intact, normal speech, answering questions appropriately, 5/5 strength to bilateral upper and lower extremities  Medical Decision Making  Medically screening exam initiated at 9:34 PM.  Appropriate orders placed.  Princess Karnes was informed that the remainder of the evaluation will be completed by another provider, this initial triage assessment does not replace that evaluation, and the importance of remaining in the ED until their evaluation is complete.     Richardson Dopp 06/08/22 2137

## 2022-06-09 ENCOUNTER — Emergency Department (HOSPITAL_COMMUNITY): Payer: Medicaid Other

## 2022-06-09 ENCOUNTER — Other Ambulatory Visit: Payer: Self-pay

## 2022-06-09 DIAGNOSIS — Z6835 Body mass index (BMI) 35.0-35.9, adult: Secondary | ICD-10-CM | POA: Diagnosis not present

## 2022-06-09 DIAGNOSIS — I129 Hypertensive chronic kidney disease with stage 1 through stage 4 chronic kidney disease, or unspecified chronic kidney disease: Secondary | ICD-10-CM | POA: Diagnosis present

## 2022-06-09 DIAGNOSIS — E860 Dehydration: Secondary | ICD-10-CM | POA: Diagnosis present

## 2022-06-09 DIAGNOSIS — N184 Chronic kidney disease, stage 4 (severe): Secondary | ICD-10-CM | POA: Diagnosis present

## 2022-06-09 DIAGNOSIS — Z882 Allergy status to sulfonamides status: Secondary | ICD-10-CM | POA: Diagnosis not present

## 2022-06-09 DIAGNOSIS — N179 Acute kidney failure, unspecified: Secondary | ICD-10-CM | POA: Diagnosis present

## 2022-06-09 DIAGNOSIS — Z79899 Other long term (current) drug therapy: Secondary | ICD-10-CM | POA: Diagnosis not present

## 2022-06-09 DIAGNOSIS — J432 Centrilobular emphysema: Secondary | ICD-10-CM | POA: Diagnosis present

## 2022-06-09 DIAGNOSIS — J9601 Acute respiratory failure with hypoxia: Secondary | ICD-10-CM | POA: Diagnosis present

## 2022-06-09 DIAGNOSIS — E1122 Type 2 diabetes mellitus with diabetic chronic kidney disease: Secondary | ICD-10-CM | POA: Diagnosis present

## 2022-06-09 DIAGNOSIS — U071 COVID-19: Secondary | ICD-10-CM

## 2022-06-09 DIAGNOSIS — R0902 Hypoxemia: Secondary | ICD-10-CM | POA: Diagnosis present

## 2022-06-09 DIAGNOSIS — Z87891 Personal history of nicotine dependence: Secondary | ICD-10-CM | POA: Diagnosis not present

## 2022-06-09 DIAGNOSIS — E669 Obesity, unspecified: Secondary | ICD-10-CM | POA: Diagnosis present

## 2022-06-09 DIAGNOSIS — Z96652 Presence of left artificial knee joint: Secondary | ICD-10-CM | POA: Diagnosis present

## 2022-06-09 DIAGNOSIS — E785 Hyperlipidemia, unspecified: Secondary | ICD-10-CM | POA: Diagnosis present

## 2022-06-09 LAB — CBC
HCT: 34.6 % — ABNORMAL LOW (ref 36.0–46.0)
Hemoglobin: 11.5 g/dL — ABNORMAL LOW (ref 12.0–15.0)
MCH: 26.7 pg (ref 26.0–34.0)
MCHC: 33.2 g/dL (ref 30.0–36.0)
MCV: 80.3 fL (ref 80.0–100.0)
Platelets: 255 10*3/uL (ref 150–400)
RBC: 4.31 MIL/uL (ref 3.87–5.11)
RDW: 16.1 % — ABNORMAL HIGH (ref 11.5–15.5)
WBC: 3.8 10*3/uL — ABNORMAL LOW (ref 4.0–10.5)
nRBC: 0 % (ref 0.0–0.2)

## 2022-06-09 LAB — GLUCOSE, CAPILLARY
Glucose-Capillary: 137 mg/dL — ABNORMAL HIGH (ref 70–99)
Glucose-Capillary: 170 mg/dL — ABNORMAL HIGH (ref 70–99)
Glucose-Capillary: 202 mg/dL — ABNORMAL HIGH (ref 70–99)
Glucose-Capillary: 207 mg/dL — ABNORMAL HIGH (ref 70–99)

## 2022-06-09 LAB — URINALYSIS, ROUTINE W REFLEX MICROSCOPIC
Bilirubin Urine: NEGATIVE
Glucose, UA: NEGATIVE mg/dL
Hgb urine dipstick: NEGATIVE
Ketones, ur: NEGATIVE mg/dL
Leukocytes,Ua: NEGATIVE
Nitrite: NEGATIVE
Protein, ur: >=300 mg/dL — AB
Specific Gravity, Urine: 1.005 (ref 1.005–1.030)
pH: 5 (ref 5.0–8.0)

## 2022-06-09 LAB — LACTATE DEHYDROGENASE: LDH: 155 U/L (ref 98–192)

## 2022-06-09 LAB — CREATININE, SERUM
Creatinine, Ser: 3.13 mg/dL — ABNORMAL HIGH (ref 0.44–1.00)
GFR, Estimated: 17 mL/min — ABNORMAL LOW (ref >60)

## 2022-06-09 LAB — HEMOGLOBIN A1C
Hgb A1c MFr Bld: 6 % — ABNORMAL HIGH (ref 4.8–5.6)
Mean Plasma Glucose: 125.5 mg/dL

## 2022-06-09 LAB — HIV ANTIBODY (ROUTINE TESTING W REFLEX): HIV Screen 4th Generation wRfx: NONREACTIVE

## 2022-06-09 LAB — TROPONIN I (HIGH SENSITIVITY): Troponin I (High Sensitivity): 5 ng/L (ref <18)

## 2022-06-09 LAB — D-DIMER, QUANTITATIVE: D-Dimer, Quant: 1.15 ug/mL-FEU — ABNORMAL HIGH (ref 0.00–0.50)

## 2022-06-09 LAB — CBG MONITORING, ED: Glucose-Capillary: 80 mg/dL (ref 70–99)

## 2022-06-09 LAB — SARS CORONAVIRUS 2 BY RT PCR: SARS Coronavirus 2 by RT PCR: POSITIVE — AB

## 2022-06-09 LAB — C-REACTIVE PROTEIN: CRP: 1.3 mg/dL — ABNORMAL HIGH (ref <1.0)

## 2022-06-09 MED ORDER — METOPROLOL TARTRATE 50 MG PO TABS
100.0000 mg | ORAL_TABLET | Freq: Two times a day (BID) | ORAL | Status: DC
  Administered 2022-06-09 – 2022-06-11 (×5): 100 mg via ORAL
  Filled 2022-06-09 (×4): qty 2
  Filled 2022-06-09: qty 4

## 2022-06-09 MED ORDER — DEXAMETHASONE 4 MG PO TABS
6.0000 mg | ORAL_TABLET | ORAL | Status: DC
  Administered 2022-06-09: 6 mg via ORAL
  Filled 2022-06-09: qty 2

## 2022-06-09 MED ORDER — ACETAMINOPHEN 325 MG PO TABS: 650.0000 mg | ORAL_TABLET | Freq: Four times a day (QID) | ORAL | Status: DC | PRN

## 2022-06-09 MED ORDER — ENOXAPARIN SODIUM 40 MG/0.4ML IJ SOSY: 40.0000 mg | PREFILLED_SYRINGE | INTRAMUSCULAR | Status: DC

## 2022-06-09 MED ORDER — INSULIN ASPART 100 UNIT/ML IJ SOLN
0.0000 [IU] | Freq: Every day | INTRAMUSCULAR | Status: DC
  Administered 2022-06-09 – 2022-06-10 (×2): 2 [IU] via SUBCUTANEOUS

## 2022-06-09 MED ORDER — SODIUM CHLORIDE 0.9 % IV BOLUS
500.0000 mL | Freq: Once | INTRAVENOUS | Status: AC
  Administered 2022-06-09: 500 mL via INTRAVENOUS

## 2022-06-09 MED ORDER — HEPARIN SODIUM (PORCINE) 5000 UNIT/ML IJ SOLN
5000.0000 [IU] | Freq: Three times a day (TID) | INTRAMUSCULAR | Status: DC
  Administered 2022-06-09 – 2022-06-11 (×6): 5000 [IU] via SUBCUTANEOUS
  Filled 2022-06-09 (×6): qty 1

## 2022-06-09 MED ORDER — ADULT MULTIVITAMIN W/MINERALS CH
1.0000 | ORAL_TABLET | Freq: Every day | ORAL | Status: DC
  Administered 2022-06-09 – 2022-06-11 (×3): 1 via ORAL
  Filled 2022-06-09 (×3): qty 1

## 2022-06-09 MED ORDER — SODIUM CHLORIDE 0.9 % IV SOLN: INTRAVENOUS | Status: DC

## 2022-06-09 MED ORDER — INSULIN ASPART 100 UNIT/ML IJ SOLN
0.0000 [IU] | Freq: Three times a day (TID) | INTRAMUSCULAR | Status: DC
  Administered 2022-06-09 – 2022-06-10 (×2): 3 [IU] via SUBCUTANEOUS
  Administered 2022-06-10: 5 [IU] via SUBCUTANEOUS
  Administered 2022-06-10: 2 [IU] via SUBCUTANEOUS

## 2022-06-09 MED ORDER — GABAPENTIN 300 MG PO CAPS
300.0000 mg | ORAL_CAPSULE | Freq: Every day | ORAL | Status: DC
  Administered 2022-06-09 – 2022-06-10 (×2): 300 mg via ORAL
  Filled 2022-06-09 (×2): qty 1

## 2022-06-09 MED ORDER — FOLIC ACID 1 MG PO TABS
1.0000 mg | ORAL_TABLET | Freq: Every day | ORAL | Status: DC
  Administered 2022-06-09 – 2022-06-11 (×3): 1 mg via ORAL
  Filled 2022-06-09 (×3): qty 1

## 2022-06-09 MED ORDER — METHYLPREDNISOLONE SODIUM SUCC 125 MG IJ SOLR
120.0000 mg | Freq: Every day | INTRAMUSCULAR | Status: DC
  Administered 2022-06-09: 120 mg via INTRAVENOUS
  Filled 2022-06-09: qty 2

## 2022-06-09 MED ORDER — AMLODIPINE BESYLATE 10 MG PO TABS
10.0000 mg | ORAL_TABLET | Freq: Every day | ORAL | Status: DC
  Administered 2022-06-09 – 2022-06-11 (×3): 10 mg via ORAL
  Filled 2022-06-09: qty 2
  Filled 2022-06-09 (×2): qty 1

## 2022-06-09 MED ORDER — NIRMATRELVIR/RITONAVIR (PAXLOVID) TABLET (RENAL DOSING)
2.0000 | ORAL_TABLET | Freq: Two times a day (BID) | ORAL | Status: DC
  Administered 2022-06-09 – 2022-06-10 (×3): 2 via ORAL
  Filled 2022-06-09: qty 20

## 2022-06-09 MED ORDER — ONDANSETRON HCL 4 MG PO TABS: 4.0000 mg | ORAL_TABLET | Freq: Four times a day (QID) | ORAL | Status: DC | PRN

## 2022-06-09 MED ORDER — METHYLPREDNISOLONE SODIUM SUCC 40 MG IJ SOLR
40.0000 mg | Freq: Every day | INTRAMUSCULAR | Status: DC
  Administered 2022-06-10: 40 mg via INTRAVENOUS
  Filled 2022-06-09: qty 1

## 2022-06-09 MED ORDER — ACETAMINOPHEN 650 MG RE SUPP: 650.0000 mg | Freq: Four times a day (QID) | RECTAL | Status: DC | PRN

## 2022-06-09 MED ORDER — ONDANSETRON HCL 4 MG/2ML IJ SOLN: 4.0000 mg | Freq: Four times a day (QID) | INTRAMUSCULAR | Status: DC | PRN

## 2022-06-09 NOTE — ED Notes (Signed)
Sleeping/ resting, NAD, calm, interactive. Denies pain or nausea. VSS.

## 2022-06-09 NOTE — ED Notes (Signed)
ED TO INPATIENT HANDOFF REPORT  ED Nurse Name and Phone #: Al Corpus, RN [phone]  S Name/Age/Gender Rose Ross 57 y.o. female Room/Bed: 021C/021C  Code Status   Code Status: Full Code  Home/SNF/Other Home Patient oriented to: self, place, time, and situation Is this baseline? Yes   Triage Complete: Triage complete  Chief Complaint Acute hypoxemic respiratory failure due to COVID-19 [U07.1, J96.01] Acute kidney injury [N17.9]  Triage Note Doctors appt with PCP in morning. Fever was 100.3 and he gave her tylenol. Fever has resolved now so now husband thinking they may go home.  Pt has new chest pain and SOB today and dizzy. She reports feeling "foggy" and sleepy. Belly is tender to palpation.    Allergies Allergies  Allergen Reactions   Sulfa Antibiotics Hives    Level of Care/Admitting Diagnosis ED Disposition     ED Disposition  Admit   Condition  --   Comment  Hospital Area: [facility] [[id number]]  Level of Care: Telemetry Medical [[id number]]  May admit patient to [facility] or [facility] if equivalent level of care is available:: Yes  Covid Evaluation: Confirmed COVID Positive  Diagnosis: Acute kidney injury [454098]  Admitting Physician: Rolly Salter [[id number]]  Attending Physician: Russella Dar [[id number]]  Certification:: I certify this patient will need inpatient services for at least 2 midnights          B Medical/Surgery History Past Medical History:  Diagnosis Date   Centrilobular emphysema    CKD (chronic kidney disease) stage 4, GFR 15-29 ml/min    Hypertensive nephropathy   Diabetes mellitus    Fatty liver    HLD (hyperlipidemia)    Hypertension    Past Surgical History:  Procedure Laterality Date   ABDOMINAL HYSTERECTOMY     OOPHORECTOMY     TOTAL KNEE ARTHROPLASTY Left 2021     A IV Location/Drains/Wounds Patient Lines/Drains/Airways Status     Active Line/Drains/Airways     Name Placement date Placement time  Site Days   Peripheral IV 06/09/22 20 G 1" Anterior;Right Forearm 06/09/22  0401  Forearm  less than 1            Intake/Output Last 24 hours  Intake/Output Summary (Last 24 hours) at 06/09/2022 0857 Last data filed at 06/09/2022 0517 Gross per 24 hour  Intake 500 ml  Output --  Net 500 ml    Labs/Imaging Results for orders placed or performed during the hospital encounter of 06/08/22 (from the past 48 hour(s))  CBC     Status: Abnormal   Collection Time: 06/08/22 10:12 PM  Result Value Ref Range   WBC 5.4 4.0 - 10.5 K/uL   RBC 4.58 3.87 - 5.11 MIL/uL   Hemoglobin 12.1 12.0 - 15.0 g/dL   HCT 29.5 62.1 - 30.8 %   MCV 81.0 80.0 - 100.0 fL   MCH 26.4 26.0 - 34.0 pg   MCHC 32.6 30.0 - 36.0 g/dL   RDW 65.7 (H) 84.6 - 96.2 %   Platelets 298 150 - 400 K/uL   nRBC 0.0 0.0 - 0.2 %    Comment: Performed at [facility], [address] [place], [place] [address]  Troponin I (High Sensitivity)     Status: None   Collection Time: 06/08/22 10:12 PM  Result Value Ref Range   Troponin I (High Sensitivity) 6 <18 ng/L    Comment: (NOTE) Elevated high sensitivity troponin I (hsTnI) values and significant  changes across  serial measurements may suggest ACS but many other  chronic and acute conditions are known to elevate hsTnI results.  Refer to the "Links" section for chest pain algorithms and additional  guidance. Performed at [facility], [address] [place], [place] [address]   Comprehensive metabolic panel     Status: Abnormal   Collection Time: 06/08/22 10:12 PM  Result Value Ref Range   Sodium 136 135 - 145 mmol/L   Potassium 4.3 3.5 - 5.1 mmol/L   Chloride 102 98 - 111 mmol/L   CO2 24 22 - 32 mmol/L   Glucose, Bld 96 70 - 99 mg/dL    Comment: Glucose reference range applies only to samples taken after fasting for at least 8 hours.   BUN 34 (H) 6 - 20 mg/dL   Creatinine, Ser 6.04 (H) 0.44 - 1.00 mg/dL   Calcium 8.9 8.9 - 54.0 mg/dL   Total Protein 7.2  6.5 - 8.1 g/dL   Albumin 3.5 3.5 - 5.0 g/dL   AST 18 15 - 41 U/L   ALT 15 0 - 44 U/L   Alkaline Phosphatase 55 38 - 126 U/L   Total Bilirubin 0.6 0.3 - 1.2 mg/dL   GFR, Estimated 16 (L) >60 mL/min    Comment: (NOTE) Calculated using the CKD-EPI Creatinine Equation (2021)    Anion gap 10 5 - 15    Comment: Performed at [facility], [address], [place], [place] [address]  Lipase, blood     Status: Abnormal   Collection Time: 06/08/22 10:12 PM  Result Value Ref Range   Lipase 63 (H) 11 - 51 U/L    Comment: Performed at [facility], [address] [place], [place] [address]  Troponin I (High Sensitivity)     Status: None   Collection Time: 06/08/22 11:40 PM  Result Value Ref Range   Troponin I (High Sensitivity) 5 <18 ng/L    Comment: (NOTE) Elevated high sensitivity troponin I (hsTnI) values and significant  changes across serial measurements may suggest ACS but many other  chronic and acute conditions are known to elevate hsTnI results.  Refer to the "Links" section for chest pain algorithms and additional  guidance. Performed at [facility], [address], [place], [place] [address]   Urinalysis, Routine w reflex microscopic -Urine, Clean Catch     Status: Abnormal   Collection Time: 06/09/22  1:00 AM  Result Value Ref Range   Color, Urine YELLOW YELLOW   APPearance CLEAR CLEAR   Specific Gravity, Urine 1.005 1.005 - 1.030   pH 5.0 5.0 - 8.0   Glucose, UA NEGATIVE NEGATIVE mg/dL   Hgb urine dipstick NEGATIVE NEGATIVE   Bilirubin Urine NEGATIVE NEGATIVE   Ketones, ur NEGATIVE NEGATIVE mg/dL   Protein, ur >=308 (A) NEGATIVE mg/dL   Nitrite NEGATIVE NEGATIVE   Leukocytes,Ua NEGATIVE NEGATIVE   RBC / HPF 0-5 0 - 5 RBC/hpf   WBC, UA 0-5 0 - 5 WBC/hpf   Bacteria, UA RARE (A) NONE SEEN   Squamous Epithelial / HPF 0-5 0 - 5 /HPF   Mucus PRESENT     Comment: Performed at [facility], [address] [place], [place] [address]  SARS Coronavirus  2 by RT PCR (hospital order, performed in [facility] hospital lab) *cepheid single result test* Anterior Nasal Swab     Status: Abnormal   Collection Time: 06/09/22  4:03 AM   Specimen: Anterior Nasal Swab  Result Value Ref Range   SARS Coronavirus 2 by RT PCR POSITIVE (A) NEGATIVE    Comment: Performed at [facility], [address] [place], [place] [address]  Hemoglobin A1c     Status: Abnormal   Collection Time: 06/09/22  6:20 AM  Result Value Ref Range   Hgb A1c MFr Bld 6.0 (H) 4.8 - 5.6 %    Comment: (NOTE) Pre diabetes:          5.7%-6.4%  Diabetes:              >6.4%  Glycemic control for   <7.0% adults with diabetes    Mean Plasma Glucose 125.5 mg/dL    Comment: Performed at [facility], [address] [place], [place] [address]  CBC     Status: Abnormal   Collection Time: 06/09/22  6:20 AM  Result Value Ref Range   WBC 3.8 (L) 4.0 - 10.5 K/uL   RBC 4.31 3.87 - 5.11 MIL/uL   Hemoglobin 11.5 (L) 12.0 - 15.0 g/dL   HCT 09.8 (L) 11.9 - 14.7 %   MCV 80.3 80.0 - 100.0 fL   MCH 26.7 26.0 - 34.0 pg   MCHC 33.2 30.0 - 36.0 g/dL   RDW 82.9 (H) 56.2 - 13.0 %   Platelets 255 150 - 400 K/uL   nRBC 0.0 0.0 - 0.2 %    Comment: Performed at [facility], [address], [place], [place] [address]  CBG monitoring, ED     Status: None   Collection Time: 06/09/22  7:41 AM  Result Value Ref Range   Glucose-Capillary 80 70 - 99 mg/dL    Comment: Glucose reference range applies only to samples taken after fasting for at least 8 hours.   CT ABDOMEN PELVIS WO CONTRAST  Result Date: 06/09/2022 CLINICAL DATA:  Left upper quadrant abdominal pain, fever EXAM: CT ABDOMEN AND PELVIS WITHOUT CONTRAST TECHNIQUE: Multidetector CT imaging of the abdomen and pelvis was performed following the standard protocol without IV contrast. RADIATION DOSE REDUCTION: This exam was performed according to the departmental dose-optimization program which includes automated exposure control,  adjustment of the mA and/or kV according to patient size and/or use of iterative reconstruction technique. COMPARISON:  09/15/2020 FINDINGS: Lower chest: Mild scarring/atelectasis lung bases. Hepatobiliary: Unenhanced liver is unremarkable. Gallbladder is unremarkable. No intrahepatic or extrahepatic ductal dilatation. Pancreas: Within normal limits. Spleen: Within normal limits. Adrenals/Urinary Tract: 15 mm right adrenal nodule, measuring 6 HUs (series 3/image 21), compatible with a benign adrenal adenoma. Left adrenal gland is within normal limits. Kidneys are within normal limits. No renal calculi or hydronephrosis. Bladder is within normal limits. Stomach/Bowel: Stomach is within normal limits. No evidence of bowel obstruction. Normal appendix (series 3/image 62). Scattered colonic diverticulosis, with mild chronic mucosal hypertrophy, but without acute inflammatory changes to suggest diverticulitis. Vascular/Lymphatic: No evidence of abdominal aortic aneurysm. Atherosclerotic calcifications of the abdominal aorta and branch vessels. No suspicious abdominopelvic lymphadenopathy. Small bilateral pelvic sidewall nodes measuring up to 9 mm short axis. Reproductive: Status post hysterectomy. No adnexal masses. Other: No abdominopelvic ascites. Musculoskeletal: Mild degenerative changes of the lower thoracic spine. IMPRESSION: Colonic diverticulosis, without evidence of diverticulitis. 15 mm right adrenal adenoma, benign.  No follow-up is recommended. Electronically Signed   By: Charline Bills M.D.   On: 06/09/2022 03:55   DG Chest 2 View  Result Date: 06/08/2022 CLINICAL DATA:  Chest pain. Cough, wheezing, chills. EXAM: CHEST - 2 VIEW COMPARISON:  Radiograph 09/15/2020 FINDINGS: The heart is normal in size. Stable mediastinal contours. Streaky opacities in both lung bases, as well as anteriorly on the lateral view. No pulmonary edema, pleural effusion, or pneumothorax. Limited assessment, no acute osseous  findings. IMPRESSION: Streaky opacities in both lung bases, may represent atelectasis or infection. Electronically Signed   By: Narda Rutherford M.D.   On: 06/08/2022 22:36    Pending Labs Unresulted Labs (From admission, onward)     Start     Ordered   06/16/22 0500  Creatinine, serum  (enoxaparin (LOVENOX)    CrCl >/= 30 ml/min)  Weekly,   R     Comments: while on enoxaparin therapy    06/09/22 0626   06/10/22 0500  Lactate dehydrogenase  Daily,   R      06/09/22 0619   06/10/22 0500  D-dimer, quantitative  Daily,   R      06/09/22 0619   06/10/22 0500  C-reactive protein  Daily,   R      06/09/22 0619   06/10/22 0500  Basic metabolic panel  Tomorrow morning,   R        06/09/22 0626   06/10/22 0500  CBC  Tomorrow morning,   R        06/09/22 0626   06/09/22 0655  Creatinine, serum  Once,   R        06/09/22 0655   06/09/22 0655  HIV Antibody (routine testing w rflx)  Once,   R        06/09/22 0655   06/09/22 0655  C-reactive protein  Once,   AD        06/09/22 0655   06/09/22 0655  D-dimer, quantitative  Once,   AD        06/09/22 0655   06/09/22 0655  Lactate dehydrogenase  Once,   AD        06/09/22 0655            Vitals/Pain Today's Vitals   06/09/22 0700 06/09/22 0730 06/09/22 0756 06/09/22 0757  BP: (!) 146/93 (!) 154/96    Pulse: 87 85    Resp: 15 15    Temp:    98.8 F (37.1 C)  TempSrc:    Oral  SpO2: 90% 90%    PainSc:   0-No pain     Isolation Precautions Airborne and Contact precautions  Medications Medications  insulin aspart (novoLOG) injection 0-15 Units ( Subcutaneous Not Given 06/09/22 0752)  insulin aspart (novoLOG) injection 0-5 Units (has no administration in time range)  0.9 %  sodium chloride infusion ( Intravenous New Bag/Given 06/09/22 0655)  amLODipine (NORVASC) tablet 10 mg (has no administration in time range)  metoprolol tartrate (LOPRESSOR) tablet 100 mg (has no administration in time range)  gabapentin (NEURONTIN) capsule 300  mg (has no administration in time range)  enoxaparin (LOVENOX) injection 40 mg (has no administration in time range)  acetaminophen (TYLENOL) tablet 650 mg (has no administration in time range)    Or  acetaminophen (TYLENOL) suppository 650 mg (has no administration in time range)  ondansetron (ZOFRAN) tablet 4 mg (has no administration in time range)    Or  ondansetron (ZOFRAN) injection 4 mg (has no administration in time range)  methylPREDNISolone sodium succinate (SOLU-MEDROL) 125 mg/2 mL injection 120 mg (120 mg Intravenous Given 06/09/22 0811)  sodium chloride 0.9 % bolus 500 mL (0 mLs Intravenous Stopped 06/09/22 0517)    Mobility walks with person assist  Focused Assessments Pulmonary Assessment Handoff:  Lung sounds:   O2 Device: Room Air      R Recommendations: See Admitting Provider Note  Report given to:   Additional Notes: up to [facility] with fatigue, new O2 rec, 2L [place]

## 2022-06-09 NOTE — ED Notes (Signed)
Admitting MD in to see 

## 2022-06-09 NOTE — ED Provider Notes (Signed)
[place] EMERGENCY DEPARTMENT AT [facility] Provider Note   CSN: [id number] Arrival date & time: 06/08/22  2113     History  Chief Complaint  Patient presents with   Fever    Rose Ross is a 57 y.o. female.  The history is provided by the patient.  Fever Rose Ross is a 57 y.o. female who presents to the Emergency Department complaining of fever.  She presents to the emergency department for evaluation of fever to 100.3 today.  She reports on Sunday she developed low energy, difficulty breathing and fatigue.  Today she developed some left upper quadrant discomfort.  Yesterday she had chest pain but this is now resolved.  She reports dry sinus and a nonproductive cough.  No nausea, vomiting, diarrhea, dysuria.  She has a history of stage IV kidney disease, type 2 diabetes, hypertension.  No recent surgeries or procedures.  No history of DVT/PE.  No prior abdominal surgeries.  Last dose of Ozempic was on Sunday.      Home Medications Prior to Admission medications   Medication Sig Start Date End Date Taking? Authorizing Provider  acetaminophen (TYLENOL) 325 MG tablet Take 2 tablets (650 mg total) by mouth every 6 (six) hours as needed for mild pain (or Fever >/= 101). 09/17/20   Sheikh, Omair Latif, DO  albuterol (VENTOLIN HFA) 108 (90 Base) MCG/ACT inhaler Inhale 2 puffs into the lungs every 4 (four) hours as needed for wheezing. 10/18/19   [provider]  amLODipine (NORVASC) 10 MG tablet Take 10 mg by mouth daily.      [provider]  atorvastatin (LIPITOR) 10 MG tablet Take 5 mg by mouth at bedtime. 08/27/20   [provider]  FARXIGA 10 MG TABS tablet Take 10 mg by mouth daily. 08/20/20   [provider]  fluticasone (FLONASE) 50 MCG/ACT nasal spray Place 1 spray into the nose daily as needed for allergies. 08/30/18   [provider]  furosemide (LASIX) 20 MG tablet Take 20 mg by mouth daily. 08/31/20   [provider]  gabapentin (NEURONTIN) 300 MG capsule Take 300 mg by mouth at bedtime. 08/31/20   [provider]  hydrALAZINE (APRESOLINE) 50 MG tablet Take 50 mg by mouth 2 (two) times daily. 08/31/20   [provider]  HYDROcodone-acetaminophen (NORCO) 10-325 MG tablet Take 1 tablet by mouth 4 (four) times daily as needed for moderate pain. 09/03/20   [provider]  losartan (COZAAR) 100 MG tablet Take 100 mg by mouth daily. 08/31/20   [provider]  metoprolol tartrate (LOPRESSOR) 100 MG tablet Take 100 mg by mouth 2 (two) times daily. 08/31/20   [provider]  omeprazole (PRILOSEC) 20 MG capsule Take 20 mg by mouth every morning. 07/30/20   [provider]  ondansetron (ZOFRAN) 4 MG tablet Take 1 tablet (4 mg total) by mouth every 6 (six) hours as needed for nausea. 09/17/20   Marguerita Merles Latif, DO  Vitamin D, Ergocalciferol, (DRISDOL) 1.25 MG (50000 UNIT) CAPS capsule Take 50,000 Units by mouth every Monday. 08/16/20   [provider]      Allergies    Sulfa antibiotics    Review of Systems   Review of Systems  Constitutional:  Positive for fever.  All other systems reviewed and are negative.   Physical Exam Updated Vital Signs BP (!) 158/103   Pulse 92   Temp 98.5 F (36.9 C) (Oral)   Resp 10   SpO2  91%  Physical Exam Vitals and nursing note reviewed.  Constitutional:      Appearance: She is well-developed.  HENT:     Head: Normocephalic and atraumatic.  Cardiovascular:     Rate and Rhythm: Normal rate and regular rhythm.     Heart sounds: No murmur heard. Pulmonary:     Effort: Pulmonary effort is normal. No respiratory distress.     Breath sounds: Normal breath sounds.  Abdominal:     Palpations: Abdomen is soft.     Tenderness: There is no guarding or rebound.     Comments: Mild generalized abdominal tenderness  Musculoskeletal:        General: No tenderness.     Comments: Trace edema to BLE   Skin:    General: Skin is warm and dry.  Neurological:     Mental Status: She is alert and oriented to person, place, and time.  Psychiatric:        Behavior: Behavior normal.     ED Results / Procedures / Treatments   Labs (all labs ordered are listed, but only abnormal results are displayed) Labs Reviewed  SARS CORONAVIRUS 2 BY RT PCR - Abnormal; Notable for the following components:      Result Value   SARS Coronavirus 2 by RT PCR POSITIVE (*)    All other components within normal limits  CBC - Abnormal; Notable for the following components:   RDW 16.1 (*)    All other components within normal limits  COMPREHENSIVE METABOLIC PANEL - Abnormal; Notable for the following components:   BUN 34 (*)    Creatinine, Ser 3.30 (*)    GFR, Estimated 16 (*)    All other components within normal limits  URINALYSIS, ROUTINE W REFLEX MICROSCOPIC - Abnormal; Notable for the following components:   Protein, ur >=300 (*)    Bacteria, UA RARE (*)    All other components within normal limits  LIPASE, BLOOD - Abnormal; Notable for the following components:   Lipase 63 (*)    All other components within normal limits  TROPONIN I (HIGH SENSITIVITY)  TROPONIN I (HIGH SENSITIVITY)    EKG None  Radiology CT ABDOMEN PELVIS WO CONTRAST  Result Date: 06/09/2022 CLINICAL DATA:  Left upper quadrant abdominal pain, fever EXAM: CT ABDOMEN AND PELVIS WITHOUT CONTRAST TECHNIQUE: Multidetector CT imaging of the abdomen and pelvis was performed following the standard protocol without IV contrast. RADIATION DOSE REDUCTION: This exam was performed according to the departmental dose-optimization program which includes automated exposure control, adjustment of the mA and/or kV according to patient size and/or use of iterative reconstruction technique. COMPARISON:  09/15/2020 FINDINGS: Lower chest: Mild scarring/atelectasis lung bases. Hepatobiliary: Unenhanced liver is unremarkable. Gallbladder is unremarkable.  No intrahepatic or extrahepatic ductal dilatation. Pancreas: Within normal limits. Spleen: Within normal limits. Adrenals/Urinary Tract: 15 mm right adrenal nodule, measuring 6 HUs (series 3/image 21), compatible with a benign adrenal adenoma. Left adrenal gland is within normal limits. Kidneys are within normal limits. No renal calculi or hydronephrosis. Bladder is within normal limits. Stomach/Bowel: Stomach is within normal limits. No evidence of bowel obstruction. Normal appendix (series 3/image 62). Scattered colonic diverticulosis, with mild chronic mucosal hypertrophy, but without acute inflammatory changes to suggest diverticulitis. Vascular/Lymphatic: No evidence of abdominal aortic aneurysm. Atherosclerotic calcifications of the abdominal aorta and branch vessels. No suspicious abdominopelvic lymphadenopathy. Small bilateral pelvic sidewall nodes measuring up to 9 mm short axis. Reproductive: Status post hysterectomy. No adnexal masses. Other: No abdominopelvic ascites. Musculoskeletal: Mild  degenerative changes of the lower thoracic spine. IMPRESSION: Colonic diverticulosis, without evidence of diverticulitis. 15 mm right adrenal adenoma, benign.  No follow-up is recommended. Electronically Signed   By: Charline Bills M.D.   On: 06/09/2022 03:55   DG Chest 2 View  Result Date: 06/08/2022 CLINICAL DATA:  Chest pain. Cough, wheezing, chills. EXAM: CHEST - 2 VIEW COMPARISON:  Radiograph 09/15/2020 FINDINGS: The heart is normal in size. Stable mediastinal contours. Streaky opacities in both lung bases, as well as anteriorly on the lateral view. No pulmonary edema, pleural effusion, or pneumothorax. Limited assessment, no acute osseous findings. IMPRESSION: Streaky opacities in both lung bases, may represent atelectasis or infection. Electronically Signed   By: Narda Rutherford M.D.   On: 06/08/2022 22:36    Procedures Procedures    Medications Ordered in ED Medications  sodium chloride 0.9 %  bolus 500 mL (0 mLs Intravenous Stopped 06/09/22 0517)    ED Course/ Medical Decision Making/ A&P                             Medical Decision Making Amount and/or Complexity of Data Reviewed Radiology: ordered.  Risk Decision regarding hospitalization.   Patient with history of diabetes, CKD and hypertension here for evaluation of malaise, shortness of breath and abdominal disc for her.  She appears as if she does not feel well but is nontoxic-appearing.  She does have abdominal tenderness without peritoneal findings on examination.  Labs with mild worsening of her CKD, most recent creatinine in care everywhere from January was 2.73.  Chest x-ray with atelectasis versus infection.  A CT abdomen pelvis was obtained given her pain and report of fever-CT scan without acute abnormality.  She was treated with IV fluid hydration for dehydration.  She is positive for COVID-19 in the emergency department.  Patient has been having hypoxic spells down to 87 at rest, down to 88 with activity.  She was placed on supplemental oxygen.  Discussed with patient findings of studies.  Given her new oxygen requirement in setting of acute illness plan to admit for observation.  Medicine consulted for observation admission.        Final Clinical Impression(s) / ED Diagnoses Final diagnoses:  COVID-19  Hypoxia    Rx / DC Orders ED Discharge Orders     None         Tilden Fossa, MD 06/09/22 [phone]

## 2022-06-09 NOTE — H&P (Addendum)
History and Physical    Patient: Rose Ross EAV:[id number] DOB: [birth year] DOA: 06/08/2022 DOS: the patient was seen and examined on 06/09/2022 PCP: Pcp, No  Patient coming from: Home  Chief Complaint:  Chief Complaint  Patient presents with   Fever   HPI: Rose Ross is a 57 y.o. female with medical history significant of diabetes mellitus, CKD 3, COPD, fatty liver disease, dyslipidemia, hypertension, obesity with BMI of 6.5.  Patient began having myalgias, chest pain with nonproductive cough attributed to allergies, shortness of breath and overall foggy mentation on Sunday.  She has had low-grade fevers of 100.3.  She has also been somewhat dizzy.  She has been having generalized abdominal pain without any guarding or rebounding and has been unable to eat or pass a bowel movement.  Exam in the ER was not consistent with an obstructive pattern and CT of the abdomen and pelvis was unremarkable.  She did test positive for COVID.  Her room air sats were between 87 and 88%.  Hospitalist was consulted for admission.  She was started on Decadron 6 mg every 6 hours.  After receiving steroids her sats had improved to 95% on room air.  Oxygen was left in place in the mid her O2 levels decreased while sleeping.  Follow-up labs revealed a white count of 3800 with normal platelets creatinine elevated at 3.3 noting baseline creatinine between 1.8 and 2.2 with a GFR of 32.  Patient will be admitted with acute kidney injury secondary to COVID infection and associated hypoxemia as well as dehydration.   Past Medical History:  Diagnosis Date   Centrilobular emphysema    CKD (chronic kidney disease) stage 4, GFR 15-29 ml/min    Hypertensive nephropathy   Diabetes mellitus    Fatty liver    HLD (hyperlipidemia)    Hypertension    Past Surgical History:  Procedure Laterality Date   ABDOMINAL HYSTERECTOMY     OOPHORECTOMY     TOTAL KNEE ARTHROPLASTY Left 2021   Social History:  reports that she has  quit smoking. She does not have any smokeless tobacco history on file. She reports that she does not drink alcohol and does not use drugs.  Allergies  Allergen Reactions   Sulfa Antibiotics Hives    Family History  Problem Relation Age of Onset   Diverticulitis Neg Hx     Prior to Admission medications   Medication Sig Start Date End Date Taking? Authorizing Provider  acetaminophen (TYLENOL) 325 MG tablet Take 2 tablets (650 mg total) by mouth every 6 (six) hours as needed for mild pain (or Fever >/= 101). 09/17/20  Yes Sheikh, Omair Latif, DO  albuterol (VENTOLIN HFA) 108 (90 Base) MCG/ACT inhaler Inhale 2 puffs into the lungs every 4 (four) hours as needed for wheezing. 10/18/19  Yes [provider]  amLODipine (NORVASC) 10 MG tablet Take 10 mg by mouth daily.     Yes [provider]  atorvastatin (LIPITOR) 10 MG tablet Take 5 mg by mouth at bedtime. 08/27/20  Yes [provider]  fluticasone (FLONASE) 50 MCG/ACT nasal spray Place 1 spray into the nose daily as needed for allergies. 08/30/18  Yes [provider]  furosemide (LASIX) 20 MG tablet Take 20 mg by mouth daily. 08/31/20  Yes [provider]  gabapentin (NEURONTIN) 300 MG capsule Take 300 mg by mouth at bedtime. 08/31/20  Yes [provider]  hydrALAZINE (APRESOLINE) 50 MG tablet Take 50 mg by mouth 2 (two) times daily. 08/31/20  Yes [provider]  HYDROcodone-acetaminophen (NORCO) 10-325 MG tablet Take 1 tablet by mouth 4 (four) times daily as needed for moderate pain. 09/03/20  Yes [provider]  losartan (COZAAR) 100 MG tablet Take 100 mg by mouth daily. 08/31/20  Yes [provider]  metoprolol tartrate (LOPRESSOR) 100 MG tablet Take 100 mg by mouth 2 (two) times daily. 08/31/20  Yes [provider]  omeprazole (PRILOSEC) 20 MG capsule Take 20 mg by mouth every morning. 07/30/20  Yes [provider]  Vitamin D, Ergocalciferol, (DRISDOL)  1.25 MG (50000 UNIT) CAPS capsule Take 50,000 Units by mouth every 7 (seven) days. Sunday 08/16/20  Yes [provider]    Physical Exam: Vitals:   06/09/22 0500 06/09/22 0530 06/09/22 0558 06/09/22 0600  BP: (!) 147/92 (!) 158/103  (!) 139/98  Pulse: 82 92  94  Resp: Temp:   99.1 F (37.3 C)   TempSrc:   Oral   SpO2: (!) 88% 91%  97%   Constitutional: NAD, calm, comfortable Eyes: PERRL, lids and conjunctivae normal ENMT: Mucous membranes are dry. Posterior pharynx clear of any exudate or lesions.Normal dentition.  Neck: normal, supple, no masses, no thyromegaly Respiratory: clear but coarse to auscultation bilaterally, no wheezing, no crackles. Normal respiratory effort. No accessory muscle use. RA oxygen sats 94% without increased work of breathing Cardiovascular: Regular rate and rhythm, no murmurs / rubs / gallops. No extremity edema. 2+ pedal pulses.   Abdomen: no tenderness, no masses palpated. No hepatosplenomegaly. Bowel sounds positive.  Musculoskeletal: no clubbing / cyanosis. No joint deformity upper and lower extremities. Good ROM, no contractures. Normal muscle tone.  Skin: no rashes, lesions, ulcers. No induration Neurologic: CN 2-12 grossly intact. Sensation intact, DTR normal. Strength 5/5 x all 4 extremities.  Psychiatric: Normal judgment and insight. Alert and oriented x 3. Normal mood.    Data Reviewed:  As noted in HPI  Assessment and Plan: Acute kidney injury secondary to dehydration in context of acute COVID infection Current creatinine 3.3 with baseline creatinine between 1.8 and 2.2 Encourage p.o. intake of fluids and continue IV fluids for now Follow labs Avoid nephrotoxic medications  Acute hypoxemic respiratory failure in context of acute COVID respiratory illness/COPD Continue IV steroids.  We will use Solu-Medrol 60 mg IV every 12 hours Oximetry has improved after administration of steroids No indication for remdesivir but will  start Paxlovid and administer for a total of 5 days Will continue O2 as needed noting while patient sleeps her O2 sats potentially could decrease Check LDH, D-dimer and CRP; if D-dimer elevated will need to change from VTE prophylaxis to full dose anticoagulation and trend D-dimer Continue preadmit MDI- no nebs with acute COVID  Mildly elevated lipase Patient presented with generalized abdominal pain and lipase was 63 CT abdomen and pelvis unremarkable Continue to monitor regarding reemergence of abdominal pain and specific symptoms  Diabetes mellitus 2 on diet management Hemoglobin A1c 6.0 this admission  Follow CBGs and provide SSI especially in context of exogenous administration of steroids  Hypertension Hold preadmission Cozaar and Lasix Avoid overcorrection of blood pressure so we will hold Apresoline as well Continue Norvasc and metoprolol Monitor for suboptimal blood pressure readings that would warrant adjustments in antihypertensive medications  Dyslipidemia Hold Lipitor for now  Obesity with BMI greater than 35  Risk factor for COVID complications    Advance Care Planning:   Code Status: Prior   VTE prophylaxis: Lovenox  Consults: None  Family Communication: Patient only  Severity of Illness: The appropriate patient status for this patient is INPATIENT. Inpatient status is judged to be reasonable and necessary in order to provide the required intensity of service to ensure the patient's safety. The patient's presenting symptoms, physical exam findings, and initial radiographic and laboratory data in the context of their chronic comorbidities is felt to place them at high risk for further clinical deterioration. Furthermore, it is not anticipated that the patient will be medically stable for discharge from the hospital within 2 midnights of admission.   * I certify that at the point of admission it is my clinical judgment that the patient will require inpatient  hospital care spanning beyond 2 midnights from the point of admission due to high intensity of service, high risk for further deterioration and high frequency of surveillance required.*  Author: Junious Silk, NP 06/09/2022 6:22 AM  For on call review [email or web address].

## 2022-06-09 NOTE — ED Notes (Addendum)
lab called and said they need 3 golds, 1 blue and a light green...they could not do add on labs, receiving RN notified.

## 2022-06-10 DIAGNOSIS — J9601 Acute respiratory failure with hypoxia: Secondary | ICD-10-CM | POA: Diagnosis not present

## 2022-06-10 DIAGNOSIS — U071 COVID-19: Secondary | ICD-10-CM | POA: Diagnosis not present

## 2022-06-10 LAB — BASIC METABOLIC PANEL
Anion gap: 7 (ref 5–15)
BUN: 34 mg/dL — ABNORMAL HIGH (ref 6–20)
CO2: 23 mmol/L (ref 22–32)
Calcium: 9 mg/dL (ref 8.9–10.3)
Chloride: 108 mmol/L (ref 98–111)
Creatinine, Ser: 2.68 mg/dL — ABNORMAL HIGH (ref 0.44–1.00)
GFR, Estimated: 20 mL/min — ABNORMAL LOW (ref >60)
Glucose, Bld: 146 mg/dL — ABNORMAL HIGH (ref 70–99)
Potassium: 4.4 mmol/L (ref 3.5–5.1)
Sodium: 138 mmol/L (ref 135–145)

## 2022-06-10 LAB — GLUCOSE, CAPILLARY
Glucose-Capillary: 140 mg/dL — ABNORMAL HIGH (ref 70–99)
Glucose-Capillary: 182 mg/dL — ABNORMAL HIGH (ref 70–99)
Glucose-Capillary: 237 mg/dL — ABNORMAL HIGH (ref 70–99)
Glucose-Capillary: 210 mg/dL — ABNORMAL HIGH (ref 70–99)

## 2022-06-10 LAB — CBC
HCT: 36.4 % (ref 36.0–46.0)
Hemoglobin: 12.3 g/dL (ref 12.0–15.0)
MCH: 26.7 pg (ref 26.0–34.0)
MCHC: 33.8 g/dL (ref 30.0–36.0)
MCV: 79 fL — ABNORMAL LOW (ref 80.0–100.0)
Platelets: 279 10*3/uL (ref 150–400)
RBC: 4.61 MIL/uL (ref 3.87–5.11)
RDW: 15.7 % — ABNORMAL HIGH (ref 11.5–15.5)
WBC: 7 10*3/uL (ref 4.0–10.5)
nRBC: 0 % (ref 0.0–0.2)

## 2022-06-10 LAB — C-REACTIVE PROTEIN: CRP: 0.8 mg/dL (ref <1.0)

## 2022-06-10 LAB — D-DIMER, QUANTITATIVE: D-Dimer, Quant: 1.04 ug/mL-FEU — ABNORMAL HIGH (ref 0.00–0.50)

## 2022-06-10 MED ORDER — PANTOPRAZOLE SODIUM 40 MG PO TBEC: 40.0000 mg | DELAYED_RELEASE_TABLET | Freq: Every day | ORAL | Status: DC

## 2022-06-10 MED ORDER — PREDNISONE 20 MG PO TABS
40.0000 mg | ORAL_TABLET | Freq: Every day | ORAL | Status: DC
  Administered 2022-06-11: 40 mg via ORAL
  Filled 2022-06-10: qty 2

## 2022-06-10 MED ORDER — PANTOPRAZOLE SODIUM 40 MG PO TBEC
40.0000 mg | DELAYED_RELEASE_TABLET | Freq: Every day | ORAL | Status: DC
  Administered 2022-06-11 (×2): 40 mg via ORAL
  Filled 2022-06-10 (×2): qty 1

## 2022-06-10 NOTE — Care Management (Signed)
Screening Note The Transition of Care Department (TOC) has reviewed patient and no TOC needs have been identified at this time. We will continue to monitor patient advancement through interdisciplinary progression rounds. If new patient transition needs arise, please place a TOC consult  

## 2022-06-10 NOTE — Progress Notes (Signed)
PROGRESS NOTE    Cierria Height  ZOX:[id number] DOB: [birth year] DOA: 06/08/2022 PCP: Pcp, No    Brief Narrative:   Rose Ross is a 57 y.o. female with medical history significant of diabetes mellitus, CKD 3, COPD, fatty liver disease, dyslipidemia, hypertension, obesity with BMI of 6.5.  Patient began having myalgias, chest pain with nonproductive cough attributed to allergies, shortness of breath and overall foggy mentation on Sunday.  She has had low-grade fevers of 100.3.  She has also been somewhat dizzy.  She has been having generalized abdominal pain without any guarding or rebounding and has been unable to eat or pass a bowel movement.  Exam in the ER was not consistent with an obstructive pattern and CT of the abdomen and pelvis was unremarkable.  She did test positive for COVID.  Her room air sats were between 87 and 88%.  Hospitalist was consulted for admission.    Assessment and Plan: Acute kidney injury secondary to dehydration in context of acute COVID infection -admission creatinine 3.3 with baseline creatinine between 1.8 and 2.2 Encourage p.o. intake of fluids and continue IV fluids for now Avoid nephrotoxic medications -BMP in AM   Acute hypoxemic respiratory failure in context of acute COVID respiratory illness/COPD -wean steroids to PO Oximetry has improved after administration of steroids No indication for remdesivir/Paxlovid-- CRP normal Continue preadmit MDI   Mildly elevated lipase Patient presented with generalized abdominal pain and lipase was 63 CT abdomen and pelvis unremarkable   Diabetes mellitus 2 on diet management Hemoglobin A1c 6.0 this admission  Follow CBGs and provide SSI especially in context of exogenous administration of steroids-- do not think she will need insulin at DC   Hypertension -resume meds as needed   Dyslipidemia Hold Lipitor for now   Obesity Estimated body mass index is 35.26 kg/m as calculated from the following:   Height  as of this encounter: 5\' 6"  (1.676 m).   Weight as of this encounter: 99.1 kg.      DVT prophylaxis: heparin injection 5,000 Units Start: 06/09/22 1400    Code Status: Full Code   Disposition Plan:  Level of care: Telemetry Medical Status is: Inpatient Remains inpatient appropriate     Consultants:  none  Subjective: Breathing better, NAD  Objective: Vitals:   06/09/22 1624 06/09/22 2129 06/10/22 0339 06/10/22 0843  BP: 114/63 126/62 132/77 133/85  Pulse: 94 93 74 90  Resp: 18 18 19 18   Temp: 97.8 F (36.6 C) 98.4 F (36.9 C) 98.1 F (36.7 C) 98.3 F (36.8 C)  TempSrc: Oral Oral Oral Oral  SpO2: 98% 96% 98% 96%  Weight:      Height:        Intake/Output Summary (Last 24 hours) at 06/10/2022 1229 Last data filed at 06/09/2022 2129 Gross per 24 hour  Intake 360 ml  Output --  Net 360 ml   Filed Weights   06/09/22 0947  Weight: 99.1 kg    Examination:   General: Appearance:    Obese female in no acute distress     Lungs:     respirations unlabored  Heart:    Normal heart rate.    MS:   All extremities are intact.    Neurologic:   Awake, alert       Data Reviewed: I have personally reviewed following labs and imaging studies  CBC: Recent Labs  Lab 06/08/22 2212 06/09/22 0620 06/10/22 0404  WBC 5.4 3.8* 7.0  HGB 12.1 11.5* 12.3  HCT  37.1 34.6* 36.4  MCV 81.0 80.3 79.0*  PLT 298 255 279   Basic Metabolic Panel: Recent Labs  Lab 06/08/22 2212 06/09/22 1022 06/10/22 0404  NA 136  --  138  K 4.3  --  4.4  CL 102  --  108  CO2 24  --  23  GLUCOSE 96  --  146*  BUN 34*  --  34*  CREATININE 3.30* 3.13* 2.68*  CALCIUM 8.9  --  9.0   GFR: Estimated Creatinine Clearance: 27.2 mL/min (A) (by C-G formula based on SCr of 2.68 mg/dL (H)). Liver Function Tests: Recent Labs  Lab 06/08/22 2212  AST 18  ALT 15  ALKPHOS 55  BILITOT 0.6  PROT 7.2  ALBUMIN 3.5   Recent Labs  Lab 06/08/22 2212  LIPASE 63*   No results for input(s):  "AMMONIA" in the last 168 hours. Coagulation Profile: No results for input(s): "INR", "PROTIME" in the last 168 hours. Cardiac Enzymes: No results for input(s): "CKTOTAL", "CKMB", "CKMBINDEX", "TROPONINI" in the last 168 hours. BNP (last 3 results) No results for input(s): "PROBNP" in the last 8760 hours. HbA1C: Recent Labs    06/09/22 0620  HGBA1C 6.0*   CBG: Recent Labs  Lab 06/09/22 1623 06/09/22 2115 06/09/22 2345 06/10/22 0842 06/10/22 1136  GLUCAP 170* 207* 202* 140* 182*   Lipid Profile: No results for input(s): "CHOL", "HDL", "LDLCALC", "TRIG", "CHOLHDL", "LDLDIRECT" in the last 72 hours. Thyroid Function Tests: No results for input(s): "TSH", "T4TOTAL", "FREET4", "T3FREE", "THYROIDAB" in the last 72 hours. Anemia Panel: No results for input(s): "VITAMINB12", "FOLATE", "FERRITIN", "TIBC", "IRON", "RETICCTPCT" in the last 72 hours. Sepsis Labs: No results for input(s): "PROCALCITON", "LATICACIDVEN" in the last 168 hours.  Recent Results (from the past 240 hour(s))  SARS Coronavirus 2 by RT PCR (hospital order, performed in [facility] hospital lab) *cepheid single result test* Anterior Nasal Swab     Status: Abnormal   Collection Time: 06/09/22  4:03 AM   Specimen: Anterior Nasal Swab  Result Value Ref Range Status   SARS Coronavirus 2 by RT PCR POSITIVE (A) NEGATIVE Final    Comment: Performed at [facility], [address], [place], [place] [address]         Radiology Studies: CT ABDOMEN PELVIS WO CONTRAST  Result Date: 06/09/2022 CLINICAL DATA:  Left upper quadrant abdominal pain, fever EXAM: CT ABDOMEN AND PELVIS WITHOUT CONTRAST TECHNIQUE: Multidetector CT imaging of the abdomen and pelvis was performed following the standard protocol without IV contrast. RADIATION DOSE REDUCTION: This exam was performed according to the departmental dose-optimization program which includes automated exposure control, adjustment of the mA and/or kV according to  patient size and/or use of iterative reconstruction technique. COMPARISON:  09/15/2020 FINDINGS: Lower chest: Mild scarring/atelectasis lung bases. Hepatobiliary: Unenhanced liver is unremarkable. Gallbladder is unremarkable. No intrahepatic or extrahepatic ductal dilatation. Pancreas: Within normal limits. Spleen: Within normal limits. Adrenals/Urinary Tract: 15 mm right adrenal nodule, measuring 6 HUs (series 3/image 21), compatible with a benign adrenal adenoma. Left adrenal gland is within normal limits. Kidneys are within normal limits. No renal calculi or hydronephrosis. Bladder is within normal limits. Stomach/Bowel: Stomach is within normal limits. No evidence of bowel obstruction. Normal appendix (series 3/image 62). Scattered colonic diverticulosis, with mild chronic mucosal hypertrophy, but without acute inflammatory changes to suggest diverticulitis. Vascular/Lymphatic: No evidence of abdominal aortic aneurysm. Atherosclerotic calcifications of the abdominal aorta and branch vessels. No suspicious abdominopelvic lymphadenopathy. Small bilateral pelvic sidewall nodes measuring up  to 9 mm short axis. Reproductive: Status post hysterectomy. No adnexal masses. Other: No abdominopelvic ascites. Musculoskeletal: Mild degenerative changes of the lower thoracic spine. IMPRESSION: Colonic diverticulosis, without evidence of diverticulitis. 15 mm right adrenal adenoma, benign.  No follow-up is recommended. Electronically Signed   By: Charline Bills M.D.   On: 06/09/2022 03:55   DG Chest 2 View  Result Date: 06/08/2022 CLINICAL DATA:  Chest pain. Cough, wheezing, chills. EXAM: CHEST - 2 VIEW COMPARISON:  Radiograph 09/15/2020 FINDINGS: The heart is normal in size. Stable mediastinal contours. Streaky opacities in both lung bases, as well as anteriorly on the lateral view. No pulmonary edema, pleural effusion, or pneumothorax. Limited assessment, no acute osseous findings. IMPRESSION: Streaky opacities in  both lung bases, may represent atelectasis or infection. Electronically Signed   By: Narda Rutherford M.D.   On: 06/08/2022 22:36        Scheduled Meds:  amLODipine  10 mg Oral Daily   folic acid  1 mg Oral Daily   gabapentin  300 mg Oral QHS   heparin injection (subcutaneous)  5,000 Units Subcutaneous Q8H   insulin aspart  0-15 Units Subcutaneous TID WC   insulin aspart  0-5 Units Subcutaneous QHS   metoprolol tartrate  100 mg Oral BID   multivitamin with minerals  1 tablet Oral Daily   [START ON 06/11/2022] predniSONE  40 mg Oral Q breakfast   Continuous Infusions:  sodium chloride 100 mL/hr at 06/09/22 1700     LOS: 1 day    Time spent: 45 minutes spent on chart review, discussion with nursing staff, consultants, updating family and interview/physical exam; more than 50% of that time was spent in counseling and/or coordination of care.    Joseph Art, DO [facility] Available via Epic secure chat 7am-7pm After these hours, please refer to coverage provider listed on [email or web address] 06/10/2022, 12:29 PM

## 2022-06-10 NOTE — Plan of Care (Signed)
  Problem: Education: Goal: Knowledge of risk factors and measures for prevention of condition will improve Outcome: Progressing   Problem: Coping: Goal: Psychosocial and spiritual needs will be supported Outcome: Progressing   Problem: Respiratory: Goal: Will maintain a patent airway Outcome: Progressing Goal: Complications related to the disease process, condition or treatment will be avoided or minimized Outcome: Progressing   Problem: Education: Goal: Ability to describe self-care measures that may prevent or decrease complications (Diabetes Survival Skills Education) will improve Outcome: Progressing Goal: Individualized Educational Video(s) Outcome: Progressing   Problem: Coping: Goal: Ability to adjust to condition or change in health will improve Outcome: Progressing   Problem: Fluid Volume: Goal: Ability to maintain a balanced intake and output will improve Outcome: Progressing   Problem: Health Behavior/Discharge Planning: Goal: Ability to identify and utilize available resources and services will improve Outcome: Progressing Goal: Ability to manage health-related needs will improve Outcome: Progressing   Problem: Metabolic: Goal: Ability to maintain appropriate glucose levels will improve Outcome: Progressing   Problem: Nutritional: Goal: Maintenance of adequate nutrition will improve Outcome: Progressing Goal: Progress toward achieving an optimal weight will improve Outcome: Progressing   Problem: Skin Integrity: Goal: Risk for impaired skin integrity will decrease Outcome: Progressing   Problem: Tissue Perfusion: Goal: Adequacy of tissue perfusion will improve Outcome: Progressing   Problem: Education: Goal: Knowledge of General Education information will improve Description: Including pain rating scale, medication(s)/side effects and non-pharmacologic comfort measures Outcome: Progressing   Problem: Health Behavior/Discharge Planning: Goal:  Ability to manage health-related needs will improve Outcome: Progressing   Problem: Clinical Measurements: Goal: Ability to maintain clinical measurements within normal limits will improve Outcome: Progressing Goal: Will remain free from infection Outcome: Progressing Goal: Diagnostic test results will improve Outcome: Progressing Goal: Respiratory complications will improve Outcome: Progressing Goal: Cardiovascular complication will be avoided Outcome: Progressing   Problem: Activity: Goal: Risk for activity intolerance will decrease Outcome: Progressing   Problem: Nutrition: Goal: Adequate nutrition will be maintained Outcome: Progressing   Problem: Coping: Goal: Level of anxiety will decrease Outcome: Progressing   Problem: Elimination: Goal: Will not experience complications related to bowel motility Outcome: Progressing Goal: Will not experience complications related to urinary retention Outcome: Progressing   Problem: Pain Managment: Goal: General experience of comfort will improve Outcome: Progressing   Problem: Safety: Goal: Ability to remain free from injury will improve Outcome: Progressing   Problem: Skin Integrity: Goal: Risk for impaired skin integrity will decrease Outcome: Progressing   

## 2022-06-11 LAB — BASIC METABOLIC PANEL
Anion gap: 10 (ref 5–15)
BUN: 35 mg/dL — ABNORMAL HIGH (ref 6–20)
CO2: 21 mmol/L — ABNORMAL LOW (ref 22–32)
Calcium: 8.6 mg/dL — ABNORMAL LOW (ref 8.9–10.3)
Chloride: 110 mmol/L (ref 98–111)
Creatinine, Ser: 2.68 mg/dL — ABNORMAL HIGH (ref 0.44–1.00)
GFR, Estimated: 20 mL/min — ABNORMAL LOW (ref >60)
Glucose, Bld: 209 mg/dL — ABNORMAL HIGH (ref 70–99)
Potassium: 4.2 mmol/L (ref 3.5–5.1)
Sodium: 141 mmol/L (ref 135–145)

## 2022-06-11 LAB — GLUCOSE, CAPILLARY: Glucose-Capillary: 138 mg/dL — ABNORMAL HIGH (ref 70–99)

## 2022-06-11 LAB — C-REACTIVE PROTEIN: CRP: 0.9 mg/dL (ref <1.0)

## 2022-06-11 LAB — D-DIMER, QUANTITATIVE: D-Dimer, Quant: 0.84 ug/mL-FEU — ABNORMAL HIGH (ref 0.00–0.50)

## 2022-06-11 MED ORDER — PREDNISONE 20 MG PO TABS: 40.0000 mg | ORAL_TABLET | Freq: Every day | ORAL | 0 refills | Status: AC

## 2022-06-11 MED ORDER — LIDOCAINE 5 % EX PTCH: 1.0000 | MEDICATED_PATCH | CUTANEOUS | 0 refills | Status: DC

## 2022-06-11 MED ORDER — FUROSEMIDE 20 MG PO TABS: 20.0000 mg | ORAL_TABLET | Freq: Every day | ORAL | Status: AC

## 2022-06-11 MED ORDER — LIDOCAINE 5 % EX PTCH
1.0000 | MEDICATED_PATCH | CUTANEOUS | Status: DC
  Administered 2022-06-11: 1 via TRANSDERMAL
  Filled 2022-06-11: qty 1

## 2022-06-11 NOTE — Progress Notes (Signed)
PT Cancellation Note  Patient Details Name: Rose Ross MRN: [id number] DOB: 1965-10-20   Cancelled TreAlima Naser Reason Eval/Treat Not Completed: PT screened, no needs identified, will sign off.  Pt is walking independently in her room and discharge planning is being done.  Will sign off unless further needs are identified.   Ivar Drape 06/11/2022, 1:02 PM  Samul Dada, PT PhD Acute Rehab Dept. Number: [facility] [id number] and [facility] [phone]

## 2022-06-11 NOTE — Discharge Summary (Signed)
Physician Discharge Summary  Michaila Kenney ZOX:[id number] DOB: [birth year] DOA: 06/08/2022  PCP: Pcp, No  Admit date: 06/08/2022 Discharge date: 06/11/2022  Admitted From: home Discharge disposition: home   Recommendations for Outpatient Follow-Up:   BMP 1 week Holding ACE and hydralazine-- resume after BP check with PCP in 1 week   Discharge Diagnosis:   Principal Problem:   Acute hypoxemic respiratory failure due to COVID-19 Active Problems:   Acute kidney injury    Discharge Condition: Improved.  Diet recommendation: Low sodium, heart healthy.  Carbohydrate-modified.  Wound care: None.  Code status: Full.   History of Present Illness:   Rose Ross is a 57 y.o. female with medical history significant of diabetes mellitus, CKD 3, COPD, fatty liver disease, dyslipidemia, hypertension, obesity with BMI of 6.5.  Patient began having myalgias, chest pain with nonproductive cough attributed to allergies, shortness of breath and overall foggy mentation on Sunday.  She has had low-grade fevers of 100.3.  She has also been somewhat dizzy.  She has been having generalized abdominal pain without any guarding or rebounding and has been unable to eat or pass a bowel movement.  Exam in the ER was not consistent with an obstructive pattern and CT of the abdomen and pelvis was unremarkable.  She did test positive for COVID.  Her room air sats were between 87 and 88%.  Hospitalist was consulted for admission.   She was started on Decadron 6 mg every 6 hours.  After receiving steroids her sats had improved to 95% on room air.  Oxygen was left in place in the mid her O2 levels decreased while sleeping.  Follow-up labs revealed a white count of 3800 with normal platelets creatinine elevated at 3.3 noting baseline creatinine between 1.8 and 2.2 with a GFR of 32.  Patient will be admitted with acute kidney injury secondary to COVID infection and associated hypoxemia as well as  dehydration.   Hospital Course by Problem:   Acute kidney injury secondary to dehydration in context of acute COVID infection -admission creatinine 3.3 with baseline creatinine between 1.8 and 2.2 Encourage p.o. intake of fluids and continue IV fluids for now Avoid nephrotoxic medications -BMP in 1 week  Acute hypoxemic respiratory failure in context of acute COVID respiratory illness/COPD -wean steroids to PO and burst treat Oximetry has improved after administration of steroids No indication for remdesivir/Paxlovid-- CRP normal Continue preadmit MDI   Mildly elevated lipase Patient presented with generalized abdominal pain and lipase was 63 CT abdomen and pelvis unremarkable   Diabetes mellitus 2 on diet management Hemoglobin A1c 6.0 this admission  -outpatient follow up   Hypertension -resume meds as needed   Dyslipidemia -resume lipitor   Obesity Estimated body mass index is 35.26 kg/m as calculated from the following:   Height as of this encounter:  (1.676 m).   Weight as of this encounter: 99.1 kg.     Medical Consultants:      Discharge Exam:   Vitals:   06/11/22 0534 06/11/22 0735  BP: 133/76 134/87  Pulse: 79 90  Resp: 18 17  Temp: 98.1 F (36.7 C) 98 F (36.7 C)  SpO2: 94% 95%   Vitals:   06/10/22 1656 06/10/22 2000 06/11/22 0534 06/11/22 0735  BP: 136/78 137/75 133/76 134/87  Pulse: 88 82 79 90  Resp: Temp: 98 F (36.7 C) 98.5 F (36.9 C) 98.1 F (36.7 C) 98 F (36.7 C)  TempSrc: Oral  Oral Oral Oral  SpO2: 97% 96% 94% 95%  Weight:      Height:        General exam: Appears calm and comfortable.   The results of significant diagnostics from this hospitalization (including imaging, microbiology, ancillary and laboratory) are listed below for reference.     Procedures and Diagnostic Studies:   CT ABDOMEN PELVIS WO CONTRAST  Result Date: 06/09/2022 CLINICAL DATA:  Left upper quadrant abdominal pain, fever EXAM:  CT ABDOMEN AND PELVIS WITHOUT CONTRAST TECHNIQUE: Multidetector CT imaging of the abdomen and pelvis was performed following the standard protocol without IV contrast. RADIATION DOSE REDUCTION: This exam was performed according to the departmental dose-optimization program which includes automated exposure control, adjustment of the mA and/or kV according to patient size and/or use of iterative reconstruction technique. COMPARISON:  09/15/2020 FINDINGS: Lower chest: Mild scarring/atelectasis lung bases. Hepatobiliary: Unenhanced liver is unremarkable. Gallbladder is unremarkable. No intrahepatic or extrahepatic ductal dilatation. Pancreas: Within normal limits. Spleen: Within normal limits. Adrenals/Urinary Tract: 15 mm right adrenal nodule, measuring 6 HUs (series 3/image 21), compatible with a benign adrenal adenoma. Left adrenal gland is within normal limits. Kidneys are within normal limits. No renal calculi or hydronephrosis. Bladder is within normal limits. Stomach/Bowel: Stomach is within normal limits. No evidence of bowel obstruction. Normal appendix (series 3/image 62). Scattered colonic diverticulosis, with mild chronic mucosal hypertrophy, but without acute inflammatory changes to suggest diverticulitis. Vascular/Lymphatic: No evidence of abdominal aortic aneurysm. Atherosclerotic calcifications of the abdominal aorta and branch vessels. No suspicious abdominopelvic lymphadenopathy. Small bilateral pelvic sidewall nodes measuring up to 9 mm short axis. Reproductive: Status post hysterectomy. No adnexal masses. Other: No abdominopelvic ascites. Musculoskeletal: Mild degenerative changes of the lower thoracic spine. IMPRESSION: Colonic diverticulosis, without evidence of diverticulitis. 15 mm right adrenal adenoma, benign.  No follow-up is recommended. Electronically Signed   By: Charline Bills M.D.   On: 06/09/2022 03:55   DG Chest 2 View  Result Date: 06/08/2022 CLINICAL DATA:  Chest pain. Cough,  wheezing, chills. EXAM: CHEST - 2 VIEW COMPARISON:  Radiograph 09/15/2020 FINDINGS: The heart is normal in size. Stable mediastinal contours. Streaky opacities in both lung bases, as well as anteriorly on the lateral view. No pulmonary edema, pleural effusion, or pneumothorax. Limited assessment, no acute osseous findings. IMPRESSION: Streaky opacities in both lung bases, may represent atelectasis or infection. Electronically Signed   By: Narda Rutherford M.D.   On: 06/08/2022 22:36     Labs:   Basic Metabolic Panel: Recent Labs  Lab 06/08/22 2212 06/09/22 1022 06/10/22 0404 06/11/22 1035  NA 136  --  138 141  K 4.3  --  4.4 4.2  CL 102  --  108 110  CO2 24  --  23 21*  GLUCOSE 96  --  146* 209*  BUN 34*  --  34* 35*  CREATININE 3.30* 3.13* 2.68* 2.68*  CALCIUM 8.9  --  9.0 8.6*   GFR Estimated Creatinine Clearance: 27.2 mL/min (A) (by C-G formula based on SCr of 2.68 mg/dL (H)). Liver Function Tests: Recent Labs  Lab 06/08/22 2212  AST 18  ALT 15  ALKPHOS 55  BILITOT 0.6  PROT 7.2  ALBUMIN 3.5   Recent Labs  Lab 06/08/22 2212  LIPASE 63*   No results for input(s): "AMMONIA" in the last 168 hours. Coagulation profile No results for input(s): "INR", "PROTIME" in the last 168 hours.  CBC: Recent Labs  Lab 06/08/22 2212 06/09/22 0620 06/10/22 0404  WBC 5.4  3.8* 7.0  HGB 12.1 11.5* 12.3  HCT 37.1 34.6* 36.4  MCV 81.0 80.3 79.0*  PLT 298 255 279   Cardiac Enzymes: No results for input(s): "CKTOTAL", "CKMB", "CKMBINDEX", "TROPONINI" in the last 168 hours. BNP: Invalid input(s): "POCBNP" CBG: Recent Labs  Lab 06/10/22 0842 06/10/22 1136 06/10/22 1655 06/10/22 2016 06/11/22 0733  GLUCAP 140* 182* 237* 210* 138*   D-Dimer Recent Labs    06/10/22 0404 06/11/22 0522  DDIMER 1.04* 0.84*   Hgb A1c Recent Labs    06/09/22 0620  HGBA1C 6.0*   Lipid Profile No results for input(s): "CHOL", "HDL", "LDLCALC", "TRIG", "CHOLHDL", "LDLDIRECT" in the last  72 hours. Thyroid function studies No results for input(s): "TSH", "T4TOTAL", "T3FREE", "THYROIDAB" in the last 72 hours.  Invalid input(s): "FREET3" Anemia work up No results for input(s): "VITAMINB12", "FOLATE", "FERRITIN", "TIBC", "IRON", "RETICCTPCT" in the last 72 hours. Microbiology Recent Results (from the past 240 hour(s))  SARS Coronavirus 2 by RT PCR (hospital order, performed in [facility] hospital lab) *cepheid single result test* Anterior Nasal Swab     Status: Abnormal   Collection Time: 06/09/22  4:03 AM   Specimen: Anterior Nasal Swab  Result Value Ref Range Status   SARS Coronavirus 2 by RT PCR POSITIVE (A) NEGATIVE Final    Comment: Performed at [facility], [address] [place], [place] [address]     Discharge Instructions:   Discharge Instructions     Diet - low sodium heart healthy   Complete by: As directed    Diet Carb Modified   Complete by: As directed    Discharge instructions   Complete by: As directed    Holding hydralazine and cozaar as BP in hospital fine without them-- have BP rechecked next week and may need to restart these meds if BP elevated BMP 1 week   Increase activity slowly   Complete by: As directed       Allergies as of 06/11/2022       Reactions   Sulfa Antibiotics Hives        Medication List     STOP taking these medications    hydrALAZINE 50 MG tablet Commonly known as: APRESOLINE   losartan 100 MG tablet Commonly known as: COZAAR       TAKE these medications    acetaminophen 325 MG tablet Commonly known as: TYLENOL Take 2 tablets (650 mg total) by mouth every 6 (six) hours as needed for mild pain (or Fever >/= 101).   albuterol 108 (90 Base) MCG/ACT inhaler Commonly known as: VENTOLIN HFA Inhale 2 puffs into the lungs every 4 (four) hours as needed for wheezing.   amLODipine 10 MG tablet Commonly known as: NORVASC Take 10 mg by mouth daily.   atorvastatin 10 MG tablet Commonly known as:  LIPITOR Take 5 mg by mouth at bedtime.   fluticasone 50 MCG/ACT nasal spray Commonly known as: FLONASE Place 1 spray into the nose daily as needed for allergies.   furosemide 20 MG tablet Commonly known as: LASIX Take 1 tablet (20 mg total) by mouth daily. Start taking on: June 14, 2022 What changed: These instructions start on June 14, 2022. If you are unsure what to do until then, ask your doctor or other care provider.   gabapentin 300 MG capsule Commonly known as: NEURONTIN Take 300 mg by mouth at bedtime.   HYDROcodone-acetaminophen 10-325 MG tablet Commonly known as: NORCO Take 1 tablet by mouth 4 (four) times daily  as needed for moderate pain.   metoprolol tartrate 100 MG tablet Commonly known as: LOPRESSOR Take 100 mg by mouth 2 (two) times daily.   omeprazole 20 MG capsule Commonly known as: PRILOSEC Take 20 mg by mouth every morning.   predniSONE 20 MG tablet Commonly known as: DELTASONE Take 2 tablets (40 mg total) by mouth daily with breakfast for 2 days. Start taking on: June 12, 2022   Vitamin D (Ergocalciferol) 1.25 MG (50000 UNIT) Caps capsule Commonly known as: DRISDOL Take 50,000 Units by mouth every 7 (seven) days. Sunday          Time coordinating discharge: 45 min  Signed:  Joseph Art DO  [facility] 06/11/2022, 12:21 PM

## 2022-06-15 ENCOUNTER — Other Ambulatory Visit: Payer: Self-pay | Admitting: Nurse Practitioner

## 2022-06-15 MED ORDER — LIDOCAINE 5 % EX OINT: 1.0000 | TOPICAL_OINTMENT | CUTANEOUS | 0 refills | Status: AC | PRN

## 2022-06-15 NOTE — Progress Notes (Signed)
Lidocaine patch not covered- changed to 5% ointment

## 2022-11-25 ENCOUNTER — Emergency Department (HOSPITAL_COMMUNITY)
Admission: EM | Admit: 2022-11-25 | Discharge: 2022-11-25 | Disposition: A | Discharge status: Home / Self Care | Payer: Medicaid Other | Attending: Emergency Medicine | Admitting: Emergency Medicine

## 2022-11-25 ENCOUNTER — Emergency Department (HOSPITAL_COMMUNITY): Payer: Medicaid Other

## 2022-11-25 ENCOUNTER — Other Ambulatory Visit: Payer: Self-pay

## 2022-11-25 DIAGNOSIS — R519 Headache, unspecified: Secondary | ICD-10-CM | POA: Diagnosis present

## 2022-11-25 DIAGNOSIS — R69 Illness, unspecified: Secondary | ICD-10-CM

## 2022-11-25 DIAGNOSIS — M26609 Unspecified temporomandibular joint disorder, unspecified side: Secondary | ICD-10-CM | POA: Diagnosis not present

## 2022-11-25 DIAGNOSIS — Z20822 Contact with and (suspected) exposure to covid-19: Secondary | ICD-10-CM | POA: Insufficient documentation

## 2022-11-25 DIAGNOSIS — M2669 Other specified disorders of temporomandibular joint: Secondary | ICD-10-CM

## 2022-11-25 LAB — CBC WITH DIFFERENTIAL/PLATELET
Abs Immature Granulocytes: 0.02 10*3/uL (ref 0.00–0.07)
Basophils Absolute: 0 10*3/uL (ref 0.0–0.1)
Basophils Relative: 0 %
Eosinophils Absolute: 0.2 10*3/uL (ref 0.0–0.5)
Eosinophils Relative: 4 %
HCT: 37.3 % (ref 36.0–46.0)
Hemoglobin: 11.7 g/dL — ABNORMAL LOW (ref 12.0–15.0)
Immature Granulocytes: 0 %
Lymphocytes Relative: 23 %
Lymphs Abs: 1.2 10*3/uL (ref 0.7–4.0)
MCH: 25.4 pg — ABNORMAL LOW (ref 26.0–34.0)
MCHC: 31.4 g/dL (ref 30.0–36.0)
MCV: 81.1 fL (ref 80.0–100.0)
Monocytes Absolute: 0.6 10*3/uL (ref 0.1–1.0)
Monocytes Relative: 10 %
Neutro Abs: 3.4 10*3/uL (ref 1.7–7.7)
Neutrophils Relative %: 63 %
Platelets: 328 10*3/uL (ref 150–400)
RBC: 4.6 MIL/uL (ref 3.87–5.11)
RDW: 16.3 % — ABNORMAL HIGH (ref 11.5–15.5)
WBC: 5.4 10*3/uL (ref 4.0–10.5)
nRBC: 0 % (ref 0.0–0.2)

## 2022-11-25 LAB — COMPREHENSIVE METABOLIC PANEL
ALT: 14 U/L (ref 0–44)
AST: 18 U/L (ref 15–41)
Albumin: 3.5 g/dL (ref 3.5–5.0)
Alkaline Phosphatase: 55 U/L (ref 38–126)
Anion gap: 10 (ref 5–15)
BUN: 30 mg/dL — ABNORMAL HIGH (ref 6–20)
CO2: 24 mmol/L (ref 22–32)
Calcium: 8.9 mg/dL (ref 8.9–10.3)
Chloride: 101 mmol/L (ref 98–111)
Creatinine, Ser: 3.55 mg/dL — ABNORMAL HIGH (ref 0.44–1.00)
GFR, Estimated: 14 mL/min — ABNORMAL LOW (ref >60)
Glucose, Bld: 95 mg/dL (ref 70–99)
Potassium: 4 mmol/L (ref 3.5–5.1)
Sodium: 135 mmol/L (ref 135–145)
Total Bilirubin: 0.7 mg/dL (ref 0.3–1.2)
Total Protein: 7.6 g/dL (ref 6.5–8.1)

## 2022-11-25 LAB — URINALYSIS, ROUTINE W REFLEX MICROSCOPIC
Bacteria, UA: NONE SEEN
Bilirubin Urine: NEGATIVE
Glucose, UA: NEGATIVE mg/dL
Hgb urine dipstick: NEGATIVE
Ketones, ur: NEGATIVE mg/dL
Leukocytes,Ua: NEGATIVE
Nitrite: NEGATIVE
Protein, ur: 100 mg/dL — AB
Specific Gravity, Urine: 1.006 (ref 1.005–1.030)
pH: 6 (ref 5.0–8.0)

## 2022-11-25 LAB — SARS CORONAVIRUS 2 BY RT PCR: SARS Coronavirus 2 by RT PCR: NEGATIVE

## 2022-11-25 MED ORDER — ACETAMINOPHEN 500 MG PO TABS
1000.0000 mg | ORAL_TABLET | Freq: Once | ORAL | Status: AC
  Administered 2022-11-25: 1000 mg via ORAL
  Filled 2022-11-25: qty 2

## 2022-11-25 NOTE — ED Notes (Signed)
Pt returned from CT.  Stretcher locked in lowest position.

## 2022-11-25 NOTE — ED Provider Notes (Signed)
Awating ct read 57 yo female with recent dental work with pain over mastoid on right. Awaiting ct.  Plan probable d/c Patient is transplant candidate    Margarita Grizzle, MD 11/29/22 1230

## 2022-11-25 NOTE — Discharge Instructions (Addendum)
You were evaluated for headache. Please use acetaminophen for pain. Follow up with your dentist- symptoms appear to be related to dental source

## 2022-11-25 NOTE — ED Triage Notes (Addendum)
Pt. Stated, I had 9 teeth pulled 3 weeks ago and got new teeth on Monday. But for 2 days Ive had a headache on the right side close to the ear. It feels like its moving up. Tylenol and Vicodin for the last 2 nights just so I could sleep. My headache is continuous. Pt stated, Im waiting to have a kidney transplant so I have to make sure everything is ok with everything.

## 2022-11-25 NOTE — ED Notes (Signed)
Patient transported to CT 

## 2022-11-25 NOTE — ED Provider Notes (Signed)
[facility] EMERGENCY DEPARTMENT AT [facility] Provider Note   CSN: [id number] Arrival date & time: 11/25/22  4098     History  Chief Complaint  Patient presents with   Headache    Rose Ross is a 57 y.o. female.  HPI Patient reports she had 9 teeth pulled about 3 weeks ago.  She reports she was immediately given dentures to use.  She reports that she was discharged on amoxicillin to take 3 times a day for 30 days but because she started getting yeast infection type symptoms she has backed that down to taking it once a day still trying to finish the entire dose.  Patient had the teeth extracted due to dental infection and gingivitis in preparation for kidney transplant.  She has kidney failure and is doing preparations for transplant.  Patient reports that she was doing pretty well she is using the dentures now but then she suddenly got a pain that is behind her right ear.  She indicates the area just behind her ear and on the mastoid.  She reports is just a sharp and then pressure-like pain.  No associated symptoms.  No nasal congestion no fever no neck stiffness no general constitutional symptoms.  The patient reports she thought it might be due to dental pain because she is now using dentures which she had not previously had and started using them right after the extractions.  She reports at 2 she was only using the top plate due to some poor fit on the bottom.  She reports at that time she was getting some pain on the lower gums but subsequently now has started to use both plates and is managing.  She called the nurse line and reports she was told to come to the emergency department for further evaluation.    Home Medications Prior to Admission medications   Medication Sig Start Date End Date Taking? Authorizing Provider  lidocaine (XYLOCAINE) 5 % ointment Apply 1 Application topically as needed. 06/15/22   Russella Dar, NP  acetaminophen (TYLENOL) 325 MG tablet Take  2 tablets (650 mg total) by mouth every 6 (six) hours as needed for mild pain (or Fever >/= 101). 09/17/20   Sheikh, Omair Latif, DO  albuterol (VENTOLIN HFA) 108 (90 Base) MCG/ACT inhaler Inhale 2 puffs into the lungs every 4 (four) hours as needed for wheezing. 10/18/19   [provider]  amLODipine (NORVASC) 10 MG tablet Take 10 mg by mouth daily.      [provider]  atorvastatin (LIPITOR) 10 MG tablet Take 5 mg by mouth at bedtime. 08/27/20   [provider]  fluticasone (FLONASE) 50 MCG/ACT nasal spray Place 1 spray into the nose daily as needed for allergies. 08/30/18   [provider]  furosemide (LASIX) 20 MG tablet Take 1 tablet (20 mg total) by mouth daily. 06/14/22   Joseph Art, DO  gabapentin (NEURONTIN) 300 MG capsule Take 300 mg by mouth at bedtime. 08/31/20   [provider]  HYDROcodone-acetaminophen (NORCO) 10-325 MG tablet Take 1 tablet by mouth 4 (four) times daily as needed for moderate pain. 09/03/20   [provider]  metoprolol tartrate (LOPRESSOR) 100 MG tablet Take 100 mg by mouth 2 (two) times daily. 08/31/20   [provider]  omeprazole (PRILOSEC) 20 MG capsule Take 20 mg by mouth every morning. 07/30/20   [provider]  Vitamin D, Ergocalciferol, (DRISDOL) 1.25 MG (50000 UNIT) CAPS capsule Take 50,000 Units  by mouth every 7 (seven) days. Sunday 08/16/20   [provider]      Allergies    Sulfa antibiotics    Review of Systems   Review of Systems  Physical Exam Updated Vital Signs BP 123/78   Pulse 78   Temp 98.3 F (36.8 C)   Resp 16   Ht 5\' 6"  (1.676 m)   Wt 98.9 kg   SpO2 92%   BMI 35.19 kg/m  Physical Exam Constitutional:      Comments: Patient is alert nontoxic clinically well in appearance.  No acute distress.  HENT:     Head:     Comments: Normocephalic without any areas of swelling.  Patient does endorse some discomfort to palpation over the mastoid process on the  right.  Also some tenderness over the temporomandibular joint but no swelling present.  No facial rashes.  Soft tissue is normal.    Right Ear: Tympanic membrane normal.     Left Ear: Tympanic membrane normal.     Nose: Nose normal.     Mouth/Throat:     Mouth: Mucous membranes are moist.     Pharynx: Oropharynx is clear.     Comments: Patient dental extractions are healed.  There are no areas of unusual swelling or erythema or drainage.  Patient is edentulous with her dentures out.  Posterior airway widely patent. Cardiovascular:     Rate and Rhythm: Normal rate and regular rhythm.  Pulmonary:     Effort: Pulmonary effort is normal.     Breath sounds: Normal breath sounds.  Abdominal:     General: There is no distension.     Palpations: Abdomen is soft.     Tenderness: There is no abdominal tenderness. There is no guarding.  Musculoskeletal:        General: Normal range of motion.     Cervical back: Neck supple.  Lymphadenopathy:     Cervical: No cervical adenopathy.  Skin:    General: Skin is warm and dry.  Neurological:     General: No focal deficit present.     Mental Status: She is oriented to person, place, and time.     Motor: No weakness.     Coordination: Coordination normal.  Psychiatric:        Mood and Affect: Mood normal.     ED Results / Procedures / Treatments   Labs (all labs ordered are listed, but only abnormal results are displayed) Labs Reviewed  CBC WITH DIFFERENTIAL/PLATELET - Abnormal; Notable for the following components:      Result Value   Hemoglobin 11.7 (*)    MCH 25.4 (*)    RDW 16.3 (*)    All other components within normal limits  COMPREHENSIVE METABOLIC PANEL - Abnormal; Notable for the following components:   BUN 30 (*)    Creatinine, Ser 3.55 (*)    GFR, Estimated 14 (*)    All other components within normal limits  URINALYSIS, ROUTINE W REFLEX MICROSCOPIC - Abnormal; Notable for the following components:   APPearance HAZY (*)     Protein, ur 100 (*)    All other components within normal limits  SARS CORONAVIRUS 2 BY RT PCR    EKG None  Radiology No results found.  Procedures Procedures    Medications Ordered in ED Medications  acetaminophen (TYLENOL) tablet 1,000 mg (1,000 mg Oral Given 11/25/22 1352)    ED Course/ Medical Decision Making/ A&P  Medical Decision Making Amount and/or Complexity of Data Reviewed Labs: ordered. Radiology: ordered.  Risk OTC drugs.  Patient presents with complaint of headache with pain at the parietal and mastoid area.  Patient had recent extensive dental extractions with history of gingivitis and decay in the setting of preparation for kidney transplant.  Patient is nontoxic and alert.  She does not have meningismus but does endorse pain in the mastoid area and temporal side of the head.  Patient does have increased risk for potential complications of deep space infection, just mastoiditis or osteomyelitis.  Given more acute and severe headache described by the patient, consideration also for subarachnoid hemorrhage.  Will obtain CT imaging.  CT scan shows translation of the condyle.  No other acute findings identified.   Patient is reassessed and exhibiting full range of motion of the jaw.  She can open and close.  She reports she has been eating graham crackers.  She can also do lateral movement.  At this time does not appear to be dislocated or entrapped.  I suspect with new alignment after complete set of dentures, patient is experiencing intermittent subluxation.  Also probably general TMJ in this setting.  She is nontoxic alert.  She has no neurologic dysfunction.  No external signs of facial infection and none by CT scan.  At this time stable for discharge.  Counseled to work with her dentist to explore pain control with other evaluation for dental alignment.        Final Clinical Impression(s) / ED Diagnoses Final diagnoses:   TMJ derangement  Severe comorbid illness    Rx / DC Orders ED Discharge Orders     None         Arby Barrette, MD 12/05/22 1310
